# Patient Record
Sex: Male | Born: 1960 | Race: White | Hispanic: No | Marital: Single | Smoking: Never smoker
Health system: Southern US, Community
[De-identification: ages and names within clinical notes are randomized; demographics above are authoritative.]

## PROBLEM LIST (undated history)

## (undated) DIAGNOSIS — T7840XA Allergy, unspecified, initial encounter: Secondary | ICD-10-CM

## (undated) DIAGNOSIS — E119 Type 2 diabetes mellitus without complications: Secondary | ICD-10-CM

## (undated) DIAGNOSIS — I10 Essential (primary) hypertension: Secondary | ICD-10-CM

## (undated) DIAGNOSIS — K219 Gastro-esophageal reflux disease without esophagitis: Secondary | ICD-10-CM

## (undated) DIAGNOSIS — J9383 Other pneumothorax: Secondary | ICD-10-CM

## (undated) DIAGNOSIS — I2510 Atherosclerotic heart disease of native coronary artery without angina pectoris: Secondary | ICD-10-CM

## (undated) DIAGNOSIS — I7300 Raynaud's syndrome without gangrene: Secondary | ICD-10-CM

## (undated) HISTORY — PX: PLEURAL SCARIFICATION: SHX748

## (undated) HISTORY — DX: Allergy, unspecified, initial encounter: T78.40XA

## (undated) HISTORY — PX: BACK SURGERY: SHX140

---

## 2001-12-18 ENCOUNTER — Emergency Department (HOSPITAL_COMMUNITY): Admission: EM | Admit: 2001-12-18 | Discharge: 2001-12-19 | Payer: Self-pay | Admitting: Emergency Medicine

## 2001-12-19 ENCOUNTER — Encounter: Payer: Self-pay | Admitting: Emergency Medicine

## 2002-01-05 ENCOUNTER — Emergency Department (HOSPITAL_COMMUNITY): Admission: EM | Admit: 2002-01-05 | Discharge: 2002-01-05 | Payer: Self-pay | Admitting: Emergency Medicine

## 2002-01-05 ENCOUNTER — Encounter: Payer: Self-pay | Admitting: Emergency Medicine

## 2008-01-18 ENCOUNTER — Encounter: Admission: RE | Admit: 2008-01-18 | Discharge: 2008-01-18 | Payer: Self-pay | Admitting: Internal Medicine

## 2009-01-13 ENCOUNTER — Encounter: Admission: RE | Admit: 2009-01-13 | Discharge: 2009-01-13 | Payer: Self-pay | Admitting: Internal Medicine

## 2010-11-08 ENCOUNTER — Observation Stay (HOSPITAL_COMMUNITY): Admission: EM | Admit: 2010-11-08 | Discharge: 2010-04-23 | Payer: Self-pay | Admitting: Emergency Medicine

## 2011-02-18 LAB — POCT I-STAT, CHEM 8
BUN: 12 mg/dL (ref 6–23)
Calcium, Ion: 0.99 mmol/L — ABNORMAL LOW (ref 1.12–1.32)
Hemoglobin: 15.6 g/dL (ref 13.0–17.0)
Sodium: 138 mEq/L (ref 135–145)
TCO2: 25 mmol/L (ref 0–100)

## 2011-02-18 LAB — BASIC METABOLIC PANEL
BUN: 10 mg/dL (ref 6–23)
CO2: 25 mEq/L (ref 19–32)
CO2: 26 mEq/L (ref 19–32)
Chloride: 106 mEq/L (ref 96–112)
Creatinine, Ser: 0.91 mg/dL (ref 0.4–1.5)
GFR calc Af Amer: >60 mL/min (ref >60)
GFR calc non Af Amer: >60 mL/min (ref >60)
Glucose, Bld: 130 mg/dL — ABNORMAL HIGH (ref 70–99)
Glucose, Bld: 137 mg/dL — ABNORMAL HIGH (ref 70–99)
Potassium: 3.5 mEq/L (ref 3.5–5.1)
Sodium: 137 mEq/L (ref 135–145)
Sodium: 140 mEq/L (ref 135–145)

## 2011-02-18 LAB — CBC
HCT: 46.1 % (ref 39.0–52.0)
Hemoglobin: 14.7 g/dL (ref 13.0–17.0)
Hemoglobin: 16.3 g/dL (ref 13.0–17.0)
MCHC: 35.3 g/dL (ref 30.0–36.0)
MCV: 93.9 fL (ref 78.0–100.0)
Platelets: 225 10*3/uL (ref 150–400)
RBC: 4.37 MIL/uL (ref 4.22–5.81)
RDW: 12.5 % (ref 11.5–15.5)
WBC: 7.1 10*3/uL (ref 4.0–10.5)

## 2011-02-18 LAB — CARDIAC PANEL(CRET KIN+CKTOT+MB+TROPI)
CK, MB: 0.7 ng/mL (ref 0.3–4.0)
Relative Index: INVALID (ref 0.0–2.5)
Relative Index: INVALID (ref 0.0–2.5)
Total CK: 42 U/L (ref 7–232)
Troponin I: 0.01 ng/mL (ref 0.00–0.06)
Troponin I: 0.01 ng/mL (ref 0.00–0.06)

## 2011-02-18 LAB — LIPID PANEL
Cholesterol: 129 mg/dL (ref 0–200)
LDL Cholesterol: 52 mg/dL (ref 0–99)
Total CHOL/HDL Ratio: 5 RATIO
Triglycerides: 254 mg/dL — ABNORMAL HIGH (ref <150)
VLDL: 51 mg/dL — ABNORMAL HIGH (ref 0–40)

## 2011-02-18 LAB — POCT CARDIAC MARKERS: Myoglobin, poc: 38.9 ng/mL (ref 12–200)

## 2011-02-18 LAB — DIFFERENTIAL
Basophils Absolute: 0 10*3/uL (ref 0.0–0.1)
Basophils Relative: 0 % (ref 0–1)
Eosinophils Absolute: 0.4 10*3/uL (ref 0.0–0.7)
Eosinophils Relative: 5 % (ref 0–5)
Monocytes Absolute: 0.9 10*3/uL (ref 0.1–1.0)

## 2011-02-18 LAB — D-DIMER, QUANTITATIVE: D-Dimer, Quant: 0.25 ug/mL-FEU (ref 0.00–0.48)

## 2011-02-18 LAB — CK TOTAL AND CKMB (NOT AT ARMC): Relative Index: INVALID (ref 0.0–2.5)

## 2012-07-07 ENCOUNTER — Ambulatory Visit (INDEPENDENT_AMBULATORY_CARE_PROVIDER_SITE_OTHER): Payer: BC Managed Care – PPO | Admitting: Family Medicine

## 2012-07-07 VITALS — BP 142/82 | HR 75 | Temp 98.9°F | Resp 17 | Ht 68.5 in | Wt 183.0 lb

## 2012-07-07 DIAGNOSIS — K9433 Esophagostomy malfunction: Secondary | ICD-10-CM

## 2012-07-07 DIAGNOSIS — K219 Gastro-esophageal reflux disease without esophagitis: Secondary | ICD-10-CM

## 2012-07-07 DIAGNOSIS — R0682 Tachypnea, not elsewhere classified: Secondary | ICD-10-CM

## 2012-07-07 DIAGNOSIS — K921 Melena: Secondary | ICD-10-CM

## 2012-07-07 DIAGNOSIS — K279 Peptic ulcer, site unspecified, unspecified as acute or chronic, without hemorrhage or perforation: Secondary | ICD-10-CM

## 2012-07-07 DIAGNOSIS — R1013 Epigastric pain: Secondary | ICD-10-CM

## 2012-07-07 LAB — POCT CBC
Lymph, poc: 2.6 (ref 0.6–3.4)
MCHC: 33 g/dL (ref 31.8–35.4)
MID (cbc): 0.8 (ref 0–0.9)
MPV: 8.5 fL (ref 0–99.8)
POC Granulocyte: 7.6 — AB (ref 2–6.9)
POC LYMPH PERCENT: 23.6 %L (ref 10–50)
POC MID %: 7.5 %M (ref 0–12)
Platelet Count, POC: 351 10*3/uL (ref 142–424)
RDW, POC: 13 %

## 2012-07-07 MED ORDER — PANTOPRAZOLE SODIUM 40 MG PO TBEC: DELAYED_RELEASE_TABLET | ORAL | Status: DC

## 2012-07-07 NOTE — Patient Instructions (Signed)
Stop the aspirin and etodolac Take your stomach medicine, pantoprazole, twice daily Don't take any Pepto-Bismol. If you see further black stools but me know.  We will make a referral for you to the gastroenterologist and call you when that appointment is for.

## 2012-07-07 NOTE — Progress Notes (Signed)
Subjective: Patient was in [place] a few days ago and had severe epigastric pain. He's had GERD problems for a long time. It causes him to fill up the other night. He was sleeping in his truck and throughout out of the door the truck so he doesn't know whether he vomited blood or not. It is in the dark. He continued to hurt. Today he had a tarry stool when he is on his way up to  [place]. 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 He has had a colonoscopy but has never had an EGD.  Objective: Chest clear. Heart regular. Abdomen soft, very tender midepigastrium, normal bowel sounds. Digital work exam has black stool. He does have little internal hemorrhoid. Extremities normal his color is good.  Results for orders placed in visit on 07/07/12  IFOBT (OCCULT BLOOD)      Component Value Range   IFOBT Positive    POCT CBC      Component Value Range   WBC 11.0 (*) 4.6 - 10.2 K/uL   Lymph, poc 2.6  0.6 - 3.4   POC LYMPH PERCENT 23.6  10 - 50 %L   MID (cbc) 0.8  0 - 0.9   POC MID % 7.5  0 - 12 %M   POC Granulocyte 7.6 (*) 2 - 6.9   Granulocyte percent 68.9  37 - 80 %G   RBC 5.36  4.69 - 6.13 M/uL   Hemoglobin 17.0  14.1 - 18.1 g/dL   HCT, POC 16.1  09.6 - 53.7 %   MCV 96.0  80 - 97 fL   MCH, POC 31.7 (*) 27 - 31.2 pg   MCHC 33.0  31.8 - 35.4 g/dL   RDW, POC 04.5     Platelet Count, POC 351  142 - 424 K/uL   MPV 8.5  0 - 99.8 fL   Assessment: Tarry stools. He has a weakly positive, in retrospect this may be from the Pepto-Bismol he took a few days ago.  Epigastric pain Internal hemorrhoid GERD Suspicion for peptic ulcer disease  Plan: Refer to gastroenterology for an EGD Increase the PPI to twice a day Stop the aspirin Absolutely no NSAID

## 2012-07-20 ENCOUNTER — Emergency Department (HOSPITAL_BASED_OUTPATIENT_CLINIC_OR_DEPARTMENT_OTHER): Payer: BC Managed Care – PPO

## 2012-07-20 ENCOUNTER — Encounter (HOSPITAL_BASED_OUTPATIENT_CLINIC_OR_DEPARTMENT_OTHER): Payer: Self-pay | Admitting: Emergency Medicine

## 2012-07-20 ENCOUNTER — Emergency Department (HOSPITAL_BASED_OUTPATIENT_CLINIC_OR_DEPARTMENT_OTHER)
Admission: EM | Admit: 2012-07-20 | Discharge: 2012-07-20 | Disposition: A | Discharge status: Home / Self Care | Payer: BC Managed Care – PPO | Attending: Emergency Medicine | Admitting: Emergency Medicine

## 2012-07-20 DIAGNOSIS — Z7982 Long term (current) use of aspirin: Secondary | ICD-10-CM | POA: Insufficient documentation

## 2012-07-20 DIAGNOSIS — I2510 Atherosclerotic heart disease of native coronary artery without angina pectoris: Secondary | ICD-10-CM | POA: Insufficient documentation

## 2012-07-20 DIAGNOSIS — M545 Low back pain, unspecified: Secondary | ICD-10-CM | POA: Insufficient documentation

## 2012-07-20 DIAGNOSIS — K219 Gastro-esophageal reflux disease without esophagitis: Secondary | ICD-10-CM | POA: Insufficient documentation

## 2012-07-20 DIAGNOSIS — I7300 Raynaud's syndrome without gangrene: Secondary | ICD-10-CM | POA: Insufficient documentation

## 2012-07-20 DIAGNOSIS — I10 Essential (primary) hypertension: Secondary | ICD-10-CM | POA: Insufficient documentation

## 2012-07-20 HISTORY — DX: Atherosclerotic heart disease of native coronary artery without angina pectoris: I25.10

## 2012-07-20 HISTORY — DX: Raynaud's syndrome without gangrene: I73.00

## 2012-07-20 HISTORY — DX: Gastro-esophageal reflux disease without esophagitis: K21.9

## 2012-07-20 HISTORY — DX: Essential (primary) hypertension: I10

## 2012-07-20 MED ORDER — ONDANSETRON HCL 4 MG/2ML IJ SOLN
4.0000 mg | Freq: Once | INTRAMUSCULAR | Status: AC
  Administered 2012-07-20: 4 mg via INTRAVENOUS
  Filled 2012-07-20: qty 2

## 2012-07-20 MED ORDER — DIAZEPAM 10 MG PO TABS: 10.0000 mg | ORAL_TABLET | Freq: Three times a day (TID) | ORAL | Status: DC | PRN

## 2012-07-20 MED ORDER — HYDROMORPHONE HCL PF 1 MG/ML IJ SOLN
1.0000 mg | Freq: Once | INTRAMUSCULAR | Status: AC
  Administered 2012-07-20: 1 mg via INTRAVENOUS
  Filled 2012-07-20: qty 1

## 2012-07-20 MED ORDER — DIAZEPAM 5 MG PO TABS
10.0000 mg | ORAL_TABLET | Freq: Once | ORAL | Status: AC
  Administered 2012-07-20: 10 mg via ORAL
  Filled 2012-07-20: qty 2

## 2012-07-20 MED ORDER — OXYCODONE-ACETAMINOPHEN 5-325 MG PO TABS
1.0000 | ORAL_TABLET | Freq: Once | ORAL | Status: AC
  Administered 2012-07-20: 1 via ORAL
  Filled 2012-07-20 (×2): qty 1

## 2012-07-20 NOTE — ED Notes (Addendum)
Walked pt in the dept.  Pt had steady gate and tolerated well.  Notified MD.

## 2012-07-20 NOTE — ED Provider Notes (Addendum)
History  This chart was scribed for Gwyneth Sprout, MD by Bennett Scrape. This patient was seen in room MH08/MH08 and the patient's care was started at 8:32PM.  CSN: [id number]  Arrival date & time 07/20/12  1954   First MD Initiated Contact with Patient 07/20/12 2032      Chief Complaint  Patient presents with  . Back Pain    The history is provided by the patient. No language interpreter was used.    Zachary Estrada is a 51 y.o. 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 He reports that the pain became unbearable after he sneezed and felt a "pop". The pain is improved with rest and aggravated with all movements. 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 Lovell Sheehan and takes Lodine 500 mg twice daily. He states that he was recently diagnosed with 3 stomach ulcers and was told to stop the Lodine. He reports that he tried to get into see Dr. Lovell Sheehan today but since his last appointment was in 2009, they stated that they needed 2 or 3 days to pull his paperwork from their files. He states that he then got into see his PCP's PA and received a shot of Toradol and Dilaudid in both hips. He states that the pain has become worse after the shots. He was also given Vicodin and prednisone but has not taken either because he hasn't eaten anything today. He denies prior episodes of similar symptoms. He denies fever, neck pain, sore throat, visual disturbance, CP, cough, SOB, abdominal pain, emesis, diarrhea, urinary symptoms, HA, weakness, and rash as associated symptoms. He has a h/o CAD, HTN and GERD. He denies smoking and alcohol use.  Dr. Peggye Ley is spine specialist.  Dr. Ronnie Derby is PCP.  Past Medical History  Diagnosis Date    . Coronary artery disease   . Hypertension   . GERD (gastroesophageal reflux disease)   . Raynaud disease     Past Surgical History  Procedure Date  . Back surgery     History reviewed. No pertinent family history.  History  Substance Use Topics  . Smoking status: Never Smoker   . Smokeless tobacco: Not on file  . Alcohol Use: No      Review of Systems  A complete 10 system review of systems was obtained and all systems are negative except as noted in the HPI and PMH.   Allergies  Review of patient's allergies indicates no known allergies.  Home Medications   Current Outpatient Rx  Name Route Sig Dispense Refill  . ASPIRIN 81 MG PO CHEW Oral Chew 81 mg by mouth daily.    . ATORVASTATIN CALCIUM 40 MG PO TABS Oral Take 40 mg by mouth daily.    . ETODOLAC 500 MG PO TABS Oral Take 500 mg by mouth 2 (two) times daily.    Marland Kitchen PANTOPRAZOLE SODIUM 40 MG PO TBEC  Take one twice daily for stomach 180 tablet 1  . TRIAMTERENE-HCTZ 37.5-25 MG PO CAPS Oral Take 1 capsule by mouth every morning.      Triage Vitals: BP 130/90  Pulse 62  Temp 98.3 F (36.8 C) (Oral)  Resp 18  Ht 5' 8.5" (1.74 m)  Wt 183  lb (83.008 kg)  BMI 27.42 kg/m2  SpO2 100%  Physical Exam  Nursing note and vitals reviewed. Constitutional: He is oriented to person, place, and time. He appears well-developed and well-nourished. No distress.  HENT:  Head: Normocephalic and atraumatic.  Eyes: EOM are normal.  Neck: Neck supple. No tracheal deviation present.  Cardiovascular: Normal rate and regular rhythm.   No murmur heard. Pulmonary/Chest: Effort normal and breath sounds normal. No respiratory distress.  Abdominal: Soft. Bowel sounds are normal. There is no tenderness. There is no rebound and no guarding.  Musculoskeletal: Normal range of motion. He exhibits tenderness. He exhibits no edema.       Lumbar Tenderness over the l spine and paralumbar muscles, normal pulses  Neurological: He is alert and  oriented to person, place, and time.       Normal motor and sensation  Skin: Skin is warm and dry.  Psychiatric: He has a normal mood and affect. His behavior is normal.    ED Course  Procedures (including critical care time)  DIAGNOSTIC STUDIES: Oxygen Saturation is 100% on room air, normal by my interpretation.    COORDINATION OF CARE: 8:48PM-Discussed treatment plan which includes an x-ray with pt at bedside and pt agreed to plan.  10:23PM-Pt rechecked and feels better. Informed pt of negative radiology reports. Discussed discharge plan and pt agreed.   Labs Reviewed - No data to display Dg Lumbar Spine Complete  07/20/2012  *RADIOLOGY REPORT*  Clinical Data: Pain post lifting.  LUMBAR SPINE - COMPLETE 4+ VIEW  Comparison:  01/13/2009  Findings: Mild narrowing L4-5 as before. There is no evidence of lumbar spine fracture.  Alignment is normal.  Intervertebral disc spaces are maintained.  IMPRESSION: Negative.   Original Report Authenticated By: Thora Lance III, M.D.      1. Lumbar pain       MDM   Pt with acute onset of back pain suggestive of lumbar disc disease.  No neurovascular compromise and no incontinence.  Pt has no infectious sx, hx of CA  or other red flags concerning for pathologic back pain.  Pt is able to ambulate but is painful.  Normal strength and reflexes on exam.  Denies trauma.  He saw his PCP today and was given IM Dilaudid and Toradol which did not improve his pain. Patient has never had back pain like this before. States that he did feel a pop when the pain started. Lumbar films are within normal limits. After 10 mg of Valium and 1 mg of Dilaudid patient is feeling much better with 2/10 pain. Patient has pain medication at home but does not have muscle relaxer. He was given Valium and will followup with Dr. Lovell Sheehan with neurosurgery should be calling him back to  I personally performed the services described in this documentation, which was scribed in  my presence.  The recorded information has been reviewed and considered.       Gwyneth Sprout, MD 07/20/12 4540  Gwyneth Sprout, MD 07/20/12 2245

## 2012-07-20 NOTE — ED Notes (Signed)
Pt had work today, while at worked heard something pop, went to see pcp, was given two injections toradol and demerol...the patient received no relief, went home and was unable to ambulate due to pain

## 2012-07-23 ENCOUNTER — Observation Stay (HOSPITAL_COMMUNITY)
Admission: EM | Admit: 2012-07-23 | Discharge: 2012-07-24 | Disposition: A | Discharge status: Home / Self Care | Payer: BC Managed Care – PPO | Attending: Emergency Medicine | Admitting: Emergency Medicine

## 2012-07-23 ENCOUNTER — Encounter (HOSPITAL_COMMUNITY): Payer: Self-pay | Admitting: *Deleted

## 2012-07-23 DIAGNOSIS — M549 Dorsalgia, unspecified: Secondary | ICD-10-CM

## 2012-07-23 DIAGNOSIS — K219 Gastro-esophageal reflux disease without esophagitis: Secondary | ICD-10-CM | POA: Insufficient documentation

## 2012-07-23 DIAGNOSIS — G8929 Other chronic pain: Principal | ICD-10-CM | POA: Insufficient documentation

## 2012-07-23 DIAGNOSIS — M545 Low back pain, unspecified: Secondary | ICD-10-CM | POA: Insufficient documentation

## 2012-07-23 DIAGNOSIS — I2510 Atherosclerotic heart disease of native coronary artery without angina pectoris: Secondary | ICD-10-CM | POA: Insufficient documentation

## 2012-07-23 DIAGNOSIS — I10 Essential (primary) hypertension: Secondary | ICD-10-CM | POA: Insufficient documentation

## 2012-07-23 MED ORDER — DIAZEPAM 5 MG PO TABS: 5.0000 mg | ORAL_TABLET | Freq: Four times a day (QID) | ORAL | Status: DC | PRN

## 2012-07-23 MED ORDER — ONDANSETRON HCL 4 MG/2ML IJ SOLN: 4.0000 mg | Freq: Four times a day (QID) | INTRAMUSCULAR | Status: DC | PRN

## 2012-07-23 MED ORDER — OXYCODONE-ACETAMINOPHEN 5-325 MG PO TABS: 1.0000 | ORAL_TABLET | ORAL | Status: DC | PRN

## 2012-07-23 MED ORDER — HYDROMORPHONE HCL PF 1 MG/ML IJ SOLN
1.0000 mg | INTRAMUSCULAR | Status: AC
  Administered 2012-07-23: 1 mg via INTRAVENOUS
  Filled 2012-07-23: qty 1

## 2012-07-23 MED ORDER — DIAZEPAM 5 MG/ML IJ SOLN
5.0000 mg | Freq: Once | INTRAMUSCULAR | Status: AC
  Administered 2012-07-23: 5 mg via INTRAVENOUS
  Filled 2012-07-23: qty 2

## 2012-07-23 MED ORDER — KETOROLAC TROMETHAMINE 30 MG/ML IJ SOLN: 30.0000 mg | Freq: Four times a day (QID) | INTRAMUSCULAR | Status: DC | PRN

## 2012-07-23 MED ORDER — HYDROMORPHONE HCL PF 1 MG/ML IJ SOLN: 1.0000 mg | INTRAMUSCULAR | Status: DC | PRN

## 2012-07-23 NOTE — ED Provider Notes (Signed)
History     CSN: [id number]  Arrival date & time 07/23/12  [place]   First MD Initiated Contact with Patient 07/23/12 2031      Chief Complaint  Patient presents with  . 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  Reports severe, constant pain in his lower back.  States yesterday it radiated down his right leg but today there is no radiation.  Denies weakness or numbness of the leg.  Denies fevers, abdominal pain, bowel or bladder changes, retention, or incontinence.  Pt was seen yesterday at [facility] ED with xrays and pain control, d/c home with vicodin, prednisone, and valium, which he states are not helping at all.  Pt has seen Dr Lovell Sheehan for his back in the past but cannot get in to see him for 5 more days.    Patient is a 51 y.o. male presenting with back pain. The history is provided by the patient.  Back Pain  Pertinent negatives include no fever, no numbness, no abdominal pain, no dysuria and no weakness.    Past Medical History  Diagnosis Date  . Coronary artery disease   . Hypertension   . GERD (gastroesophageal reflux disease)   . Raynaud disease     Past Surgical History  Procedure Date  . Back surgery     No family history on file.  History  Substance Use Topics  . Smoking status: Never Smoker   . Smokeless tobacco: Not on file  . Alcohol Use: No      Review of Systems  Constitutional: Negative for fever and chills.  Gastrointestinal: Negative for nausea, vomiting, abdominal pain, diarrhea, constipation and blood in stool.  Genitourinary: Negative for dysuria, urgency and frequency.  Musculoskeletal: Positive for back pain.  Neurological: Negative for weakness and  numbness.    Allergies  Review of patient's allergies indicates no known allergies.  Home Medications   Current Outpatient Rx  Name Route Sig Dispense Refill  . ASPIRIN 81 MG PO CHEW Oral Chew 81 mg by mouth daily.    . ATORVASTATIN CALCIUM 40 MG PO TABS Oral Take 40 mg by mouth daily.    Marland Kitchen DIAZEPAM 10 MG PO TABS Oral Take 10 mg by mouth every 8 (eight) hours as needed. For muscle spasms    . ETODOLAC 500 MG PO TABS Oral Take 500 mg by mouth daily as needed. For pain    . HYDROCODONE-ACETAMINOPHEN 5-500 MG PO TABS Oral Take 1 tablet by mouth every 6 (six) hours as needed. For pain    . PANTOPRAZOLE SODIUM 40 MG PO TBEC Oral Take 40 mg by mouth daily.    . TRIAMTERENE-HCTZ 37.5-25 MG PO TABS Oral Take 0.5 tablets by mouth daily.      BP 120/85  Pulse 74  Temp 98.4 F (36.9 C) (Oral)  Resp 18  SpO2 98%  Physical Exam  Nursing note and vitals reviewed. Constitutional: He appears well-developed and well-nourished. No distress.  HENT:  Head: Normocephalic and atraumatic.  Neck: Neck supple.  Cardiovascular: Normal rate and regular rhythm.   Pulmonary/Chest: Effort normal and breath sounds normal.  Abdominal: Soft. He exhibits no distension and no mass. There is no tenderness. There is no rebound and no guarding.  Musculoskeletal:  Cervical back: He exhibits no bony tenderness.       Thoracic back: He exhibits no bony tenderness.       Lumbar back: He exhibits bony tenderness. He exhibits no deformity.       Diffuse tenderness to L-spine without crepitus or step offs.  Lower extremities:  Strength 5/5, sensation intact, distal pulses intact.    Neurological: He is alert.  Skin: He is not diaphoretic.    ED Course  Procedures (including critical care time)  Labs Reviewed - No data to display No results found.  8:48 PM Patient seen and examined.  Will attempt pain control.  Pt has no neurological deficits, no need for emergent MRI at this time.  Will consider back pain  protocol and MRI if no improvement with medications.    10:01 PM Discussed patient with Dr Lorenso Courier who agrees patient is appropriate for back pain protocol.  Will also see patient.  Discussed patient with Johnnette Gourd, PA-C, current CDU PA who will assume care of patient upon arrival to CDU.    1. Low back pain       MDM  Pt with known L4-5 pathology with injury 4 days ago presents with uncontrolled back pain.  Seen in ED yesterday for same with xrays showing narrowing of the L4-5 space, no fractures.  Pt does not have radicular symptoms presently and no weakness or numbness of his legs.  His pain, however, is uncontrolled.  Pt discussed with Dr Lorenso Courier and pt placed on back pain protocol in CDU.  Discussed MRI with Dr Lorenso Courier and he agrees that it is not medically necessary at this time.  Pt signed out to Johnnette Gourd, PA-C, who assumes care of patient in CDU.          [place], [place] 07/23/12 2356

## 2012-07-23 NOTE — ED Notes (Signed)
The pt has chronic back pain  And since yesterday his pain has been worse.  [facility] and is not satisfied with the treatemnt he received there.  Medication not helping his pain

## 2012-07-24 MED ORDER — OXYCODONE-ACETAMINOPHEN 5-325 MG PO TABS: 2.0000 | ORAL_TABLET | ORAL | Status: DC | PRN

## 2012-07-24 MED ORDER — DEXAMETHASONE SODIUM PHOSPHATE 10 MG/ML IJ SOLN
10.0000 mg | Freq: Once | INTRAMUSCULAR | Status: AC
  Administered 2012-07-24: 10 mg via INTRAVENOUS
  Filled 2012-07-24: qty 1

## 2012-07-24 MED ORDER — DIAZEPAM 5 MG PO TABS: 5.0000 mg | ORAL_TABLET | Freq: Two times a day (BID) | ORAL | Status: DC

## 2012-07-24 MED ORDER — OXYCODONE-ACETAMINOPHEN 5-325 MG PO TABS
2.0000 | ORAL_TABLET | Freq: Once | ORAL | Status: AC
  Administered 2012-07-24: 2 via ORAL
  Filled 2012-07-24: qty 2

## 2012-07-24 NOTE — ED Provider Notes (Signed)
Agree with plan of care  Tobin Chad, MD 07/24/12 [phone]

## 2012-07-24 NOTE — ED Provider Notes (Signed)
Pt presents for evaluation of back pain. 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  He has a known hx of degen disc dx but has not had very many issues with related pain.  He was seen at [facility] yesterday and prescribed prednisone, valium, and percocet.  He is here secondary to persistent pain.  He denies any neurologic s/s other than the pain radiated down his right leg initially.  This has since resolved.  PE.  Diffuse lower back tenderness to palpation + musc spasm.  No step-offs or midline point tenderness.  No saddle anesthesia.  Intact and symmetric DTRs.  Neg Left straight leg raise.  Pain exacerbated at ~45 degrees on right.  A/P.  Pt presents for evaluation of back pain.  No focal deficit or clinical evidence of cauda equina syndrome.  At this time plan symptomatic care via IV narcotics, toradol, and valium.  Will also administer IV decadron.  Plan serial exams and transition to PO meds.  Pt already has f/u with a spine specialist next week.  Tobin Chad, MD 07/24/12 [phone]

## 2012-07-24 NOTE — ED Notes (Signed)
Pt resting quietly at this time with friend at bedside.  Saline lock in right hand, flushed with saline without any problems.

## 2012-07-24 NOTE — ED Notes (Signed)
Patient able to ambulate approx 30 feet in unit unassisted. Gait steady.

## 2012-07-24 NOTE — ED Provider Notes (Signed)
51 y/o male moved to CDU on back pain protocol. Plan to manage pain. No focal neuro deficits concerning need for MRI. Case discussed with Dr. Lorenso Courier who also evaluated patient. Will give dilaudid, decadron, valium. Case also discussed with Dr. Lynelle Doctor who will take over care of patient at this time. MRI from 2009: Clinical Data: 51 year old with low back and buttock pain occasionally down right leg.  MRI LUMBAR SPINE WITHOUT CONTRAST:  Technique: Multiplanar and multiecho pulse sequences of the lumbar spine, to include the lower thoracic and upper sacral regions, were obtained according to standard protocol without IV contrast.  Comparison: Outside plain films, 01/11/08.  Findings: The sagittal MR images demonstrate normal overall alignment of the lumbar vertebral bodies. Very slight retrolisthesis of L4 compared to L5 likely due to facet disease. No definite pars defects are seen. The vertebral bodies demonstrate normal marrow signal except for some endplate reactive changes at L4 and Schmorl's node. There are five non-rib bearing lumbar type vertebral bodies based on the plain films and the last full intervertebral disk space is labeled L5-S1. The conus medullaris terminates at T12.  No significant findings at L1-2, L2-3, or L3-4.  L4-5: There is an annular rent and a central disk protrusion which does have slight mass effect on the anterior thecal sac. No foraminal stenosis. Mild facet disease.  L5-S1: Mild disk desiccation and degeneration. There is a shallow central disk protrusion which barely contacts the anterior thecal sac. It also comes close to the right S1 nerve root.  IMPRESSION:  Small disk protrusions at L4-5 and L5-S1. No foraminal stenosis.  Provider: Argie Ramming        Trevor Mace, PA-C 07/24/12 [phone]

## 2012-07-30 ENCOUNTER — Emergency Department (HOSPITAL_COMMUNITY)
Admission: EM | Admit: 2012-07-30 | Discharge: 2012-07-31 | Disposition: A | Discharge status: Home / Self Care | Payer: BC Managed Care – PPO | Attending: Emergency Medicine | Admitting: Emergency Medicine

## 2012-07-30 ENCOUNTER — Encounter (HOSPITAL_COMMUNITY): Payer: Self-pay | Admitting: *Deleted

## 2012-07-30 DIAGNOSIS — F1010 Alcohol abuse, uncomplicated: Secondary | ICD-10-CM | POA: Insufficient documentation

## 2012-07-30 DIAGNOSIS — I10 Essential (primary) hypertension: Secondary | ICD-10-CM | POA: Insufficient documentation

## 2012-07-30 DIAGNOSIS — F3289 Other specified depressive episodes: Secondary | ICD-10-CM | POA: Insufficient documentation

## 2012-07-30 DIAGNOSIS — R45851 Suicidal ideations: Secondary | ICD-10-CM | POA: Insufficient documentation

## 2012-07-30 DIAGNOSIS — Z79899 Other long term (current) drug therapy: Secondary | ICD-10-CM | POA: Insufficient documentation

## 2012-07-30 DIAGNOSIS — K219 Gastro-esophageal reflux disease without esophagitis: Secondary | ICD-10-CM | POA: Insufficient documentation

## 2012-07-30 DIAGNOSIS — F329 Major depressive disorder, single episode, unspecified: Secondary | ICD-10-CM | POA: Insufficient documentation

## 2012-07-30 DIAGNOSIS — I2510 Atherosclerotic heart disease of native coronary artery without angina pectoris: Secondary | ICD-10-CM | POA: Insufficient documentation

## 2012-07-30 LAB — RAPID URINE DRUG SCREEN, HOSP PERFORMED
Benzodiazepines: POSITIVE — AB
Cocaine: NOT DETECTED
Opiates: NOT DETECTED

## 2012-07-30 NOTE — ED Notes (Signed)
Pts Father taking belongings and meds to fiance

## 2012-07-30 NOTE — ED Notes (Signed)
[id number]<WG> Expected date:07/30/12<BR> Expected time: 9:31 PM<BR> Means of arrival:Ambulance<BR> Comments:<BR> Hold for suicidal EMS

## 2012-07-30 NOTE — ED Notes (Signed)
Pt presents with SI. Pt has a plan to shoot himself. Pt recently lost his job and having depression. Pt admits to drinking 1/2 bottle of wine tonight. Family arriving shortly.

## 2012-07-31 LAB — ETHANOL: Alcohol, Ethyl (B): 61 mg/dL — ABNORMAL HIGH (ref 0–11)

## 2012-07-31 LAB — COMPREHENSIVE METABOLIC PANEL
Albumin: 4.3 g/dL (ref 3.5–5.2)
BUN: 13 mg/dL (ref 6–23)
Calcium: 9.5 mg/dL (ref 8.4–10.5)
Creatinine, Ser: 0.88 mg/dL (ref 0.50–1.35)
GFR calc Af Amer: >90 mL/min (ref >90)
Potassium: 3.6 mEq/L (ref 3.5–5.1)
Total Protein: 7.5 g/dL (ref 6.0–8.3)

## 2012-07-31 LAB — CBC
HCT: 49.9 % (ref 39.0–52.0)
MCH: 32.3 pg (ref 26.0–34.0)
MCHC: 37.3 g/dL — ABNORMAL HIGH (ref 30.0–36.0)
MCV: 86.8 fL (ref 78.0–100.0)
RDW: 12.2 % (ref 11.5–15.5)

## 2012-07-31 LAB — ACETAMINOPHEN LEVEL: Acetaminophen (Tylenol), Serum: <15 ug/mL (ref 10–30)

## 2012-07-31 MED ORDER — PANTOPRAZOLE SODIUM 40 MG PO TBEC
40.0000 mg | DELAYED_RELEASE_TABLET | Freq: Every day | ORAL | Status: DC
  Administered 2012-07-31: 40 mg via ORAL
  Filled 2012-07-31: qty 1

## 2012-07-31 MED ORDER — TRIAMTERENE-HCTZ 37.5-25 MG PO TABS
0.5000 | ORAL_TABLET | Freq: Every day | ORAL | Status: DC
  Administered 2012-07-31: 0.5 via ORAL
  Filled 2012-07-31: qty 0.5

## 2012-07-31 MED ORDER — ATORVASTATIN CALCIUM 40 MG PO TABS
40.0000 mg | ORAL_TABLET | Freq: Every day | ORAL | Status: DC
  Administered 2012-07-31: 40 mg via ORAL
  Filled 2012-07-31: qty 1

## 2012-07-31 MED ORDER — DIAZEPAM 5 MG PO TABS
5.0000 mg | ORAL_TABLET | Freq: Two times a day (BID) | ORAL | Status: DC
  Administered 2012-07-31 (×3): 5 mg via ORAL
  Filled 2012-07-31 (×3): qty 1

## 2012-07-31 MED ORDER — DIAZEPAM 5 MG PO TABS: 5.0000 mg | ORAL_TABLET | Freq: Two times a day (BID) | ORAL | Status: AC | PRN

## 2012-07-31 MED ORDER — OXYCODONE-ACETAMINOPHEN 5-325 MG PO TABS
1.0000 | ORAL_TABLET | ORAL | Status: DC | PRN
  Administered 2012-07-31 (×4): 2 via ORAL
  Filled 2012-07-31 (×4): qty 2

## 2012-07-31 MED ORDER — ASPIRIN 81 MG PO CHEW
81.0000 mg | CHEWABLE_TABLET | Freq: Every day | ORAL | Status: DC
  Administered 2012-07-31: 81 mg via ORAL
  Filled 2012-07-31: qty 1

## 2012-07-31 NOTE — ED Provider Notes (Signed)
Pt seen by tele=psych and cleared for discharge--I have spoke with the pt and agree and will give rx for valium  Toy Baker, MD 07/31/12 2251

## 2012-07-31 NOTE — BH Assessment (Addendum)
Assessment Note   Zachary Estrada is a 51 y.o. male who was brought in by family after reportedly endorsing SI with a plan to shoot himself with a gun after drinking 1/2 a bottle of wine. Pt denies current SI, stating he had no intention of shooting himself last night. He states he asked his 49 year old to "get my pistol so I could sit outside because it was a nice night." Pt went on the explain that his neighborhood has " a lot of hoodlums" so he felt like he needed his pistol to be safe outside. Pt states he fiance is worried about him because he has been having finical stressors and chronic pain. He denies current HI, but states he will hurt someone "if they get in my way." He denies [facility], depressive symptoms, and anxiety. Pt states that he has chronic back pain and is constantly in a lot of pain that interferes with his daily functioning. Pt states he lives with his fiance and 49 year-old son. He access to a pistol. He states he feels safe to return home at this time, stating he was never suicidal.   Telepsych has been requested to assist with disposition.       Axis I: Depressive Disorder NOS Axis II: Deferred Axis III:  Past Medical History  Diagnosis Date  . Coronary artery disease   . Hypertension   . GERD (gastroesophageal reflux disease)   . Raynaud disease    Axis IV: economic problems and other psychosocial or environmental problems Axis V: 31-40 impairment in reality testing  Past Medical History:  Past Medical History  Diagnosis Date  . Coronary artery disease   . Hypertension   . GERD (gastroesophageal reflux disease)   . Raynaud disease     Past Surgical History  Procedure Date  . Back surgery     Family History: History reviewed. No pertinent family history.  Social History:  reports that he has never smoked. He does not have any smokeless tobacco history on file. He reports that he does not drink alcohol. His drug history not on file.  Additional Social History:   Alcohol / Drug Use History of alcohol / drug use?: Yes Substance #1 Name of Substance 1: alcohol 1 - Age of First Use: 23 1 - Amount (size/oz): 2 glasses of red wine 1 - Frequency: daily 1 - Duration: for several years, states doctor told him it was healthy for his heart 1 - Last Use / Amount: 07/30/12  CIWA: CIWA-Ar BP: 127/74 mmHg Pulse Rate: 69  Nausea and Vomiting: no nausea and no vomiting Tactile Disturbances: none Tremor: no tremor Auditory Disturbances: not present Paroxysmal Sweats: no sweat visible Visual Disturbances: not present Anxiety: mildly anxious Headache, Fullness in Head: none present Agitation: normal activity Orientation and Clouding of Sensorium: oriented and can do serial additions CIWA-Ar Total: 1  COWS:    Allergies: No Known Allergies  Home Medications:  (Not in a hospital admission)  OB/GYN Status:  No LMP for male patient.  General Assessment Data Location of Assessment: [facility] Living Arrangements: Spouse/significant other;Children (35 year old) Can pt return to current living arrangement?: Yes Admission Status: Voluntary Is patient capable of signing voluntary admission?: Yes Transfer from: Acute Hospital Referral Source: MD  Education Status Is patient currently in school?: No  Risk to self Suicidal Ideation: No Suicidal Intent: No Is patient at risk for suicide?: Yes Suicidal Plan?: No Access to Means: Yes Specify Access to Suicidal Means: owns  a pistol What has been your use of drugs/alcohol within the last 12 months?: alcohol use Previous Attempts/Gestures: No How many times?: 0  Other Self Harm Risks: none Triggers for Past Attempts: None known Intentional Self Injurious Behavior: None Family Suicide History: Yes (cousin comitted suicide) Recent stressful life event(s): Job Loss Persecutory voices/beliefs?: No Depression: Yes Depression Symptoms: Despondent;Isolating Substance abuse history and/or treatment for substance  abuse?: No Suicide prevention information given to non-admitted patients: Not applicable  Risk to Others Homicidal Ideation: No-Not Currently/Within Last 6 Months Thoughts of Harm to Others: No-Not Currently Present/Within Last 6 Months Current Homicidal Intent: No-Not Currently/Within Last 6 Months Current Homicidal Plan: No Access to Homicidal Means: Yes Describe Access to Homicidal Means: has a pistol Identified Victim: none History of harm to others?: No Assessment of Violence: None Noted Violent Behavior Description: cooperative Does patient have access to weapons?: No Criminal Charges Pending?: No Does patient have a court date: No  Psychosis Hallucinations: None noted Delusions: None noted  Mental Status Report Appear/Hygiene: Other (Comment) (casual) Eye Contact: Fair Motor Activity: Unremarkable Speech: Logical/coherent Level of Consciousness: Quiet/awake Mood: Depressed Affect: Blunted Anxiety Level: None Thought Processes: Coherent;Relevant Judgement: Unimpaired Orientation: Place;Time;Situation;Person Obsessive Compulsive Thoughts/Behaviors: None  Cognitive Functioning Concentration: Normal Memory: Recent Intact;Remote Intact IQ: Average Insight: Fair Impulse Control: Fair Appetite: Good Weight Loss: 0  Weight Gain: 0  Sleep: No Change Vegetative Symptoms: None  ADLScreening [facility] Assessment Services) Patient's cognitive ability adequate to safely complete daily activities?: Yes Patient able to express need for assistance with ADLs?: Yes Independently performs ADLs?: Yes (appropriate for developmental age)  Abuse/Neglect [facility]) Physical Abuse: Denies Verbal Abuse: Denies Sexual Abuse: Denies  Prior Inpatient Therapy Prior Inpatient Therapy: No Prior Therapy Dates: na Prior Therapy Facilty/Provider(s): na Reason for Treatment: na  Prior Outpatient Therapy Prior Outpatient Therapy: No Prior Therapy Dates: na Prior Therapy Facilty/Provider(s):  na Reason for Treatment: na  ADL Screening (condition at time of admission) Patient's cognitive ability adequate to safely complete daily activities?: Yes Patient able to express need for assistance with ADLs?: Yes Independently performs ADLs?: Yes (appropriate for developmental age) Weakness of Legs: None Weakness of Arms/Hands: None  Home Assistive Devices/Equipment Home Assistive Devices/Equipment: None    Abuse/Neglect Assessment (Assessment to be complete while patient is alone) Physical Abuse: Denies Verbal Abuse: Denies Sexual Abuse: Denies Exploitation of patient/patient's resources: Denies Self-Neglect: Denies Values / Beliefs Cultural Requests During Hospitalization: None Spiritual Requests During Hospitalization: None   Advance Directives (For Healthcare) Advance Directive: Patient does not have advance directive;Patient would not like information Pre-existing out of facility DNR order (yellow form or pink MOST form): No Nutrition Screen- [facility] Patient's home diet: Regular Have you recently lost weight without trying?: No Have you been eating poorly because of a decreased appetite?: Yes Malnutrition Screening Tool Score: 1   Additional Information 1:1 In Past 12 Months?: No CIRT Risk: No Elopement Risk: No Does patient have medical clearance?: Yes     Disposition:  Disposition Disposition of Patient: Other dispositions (pending telepsych)  Telepsych recommends pt be discharged home with outpatient referrals. EDP notified and agrees with plan. Pt signed no harm contract and accepted outpatient referrals.   On Site Evaluation by:   Reviewed with Physician:     Marjean Donna 07/31/2012 8:33 PM

## 2012-07-31 NOTE — ED Provider Notes (Signed)
History     CSN: [id number] Arrival date & time 07/30/12  2140 First MD Initiated Contact with Patient 07/31/12 0001      Chief Complaint  Patient presents with  . Medical Clearance   HPI Patient presents to the emergency room with complaints of depression and suicidal ideation. Patient has been having a lot of issues including a recent job loss and trouble with chronic back pain. He has history of prior back surgery and has had a recent exacerbation of his pain. In fact, patient had an outpatient MRI from which he is awaiting the results.  Patient states he was taking his pain medications and then started drinking some alcohol this evening. He became very depressed. He asked his son to get his gun. Patient was brought in by friend for treatment and evaluation Past Medical History  Diagnosis Date  . Coronary artery disease   . Hypertension   . GERD (gastroesophageal reflux disease)   . Raynaud disease     Past Surgical History  Procedure Date  . Back surgery     History reviewed. No pertinent family history.  History  Substance Use Topics  . Smoking status: Never Smoker   . Smokeless tobacco: Not on file  . Alcohol Use: No      Review of Systems  All other systems reviewed and are negative.    Allergies  Review of patient's allergies indicates no known allergies.  Home Medications   Current Outpatient Rx  Name Route Sig Dispense Refill  . ASPIRIN 81 MG PO CHEW Oral Chew 81 mg by mouth daily.    . ATORVASTATIN CALCIUM 40 MG PO TABS Oral Take 40 mg by mouth daily.    Marland Kitchen DIAZEPAM 5 MG PO TABS Oral Take 5 mg by mouth 2 (two) times daily.    . OMEGA-3 FATTY ACIDS 1000 MG PO CAPS Oral Take 1 g by mouth daily.    . ADULT MULTIVITAMIN W/MINERALS CH Oral Take 1 tablet by mouth daily.    . OXYCODONE-ACETAMINOPHEN 5-325 MG PO TABS Oral Take 2 tablets by mouth every 4 (four) hours as needed.    Marland Kitchen PANTOPRAZOLE SODIUM 40 MG PO TBEC Oral Take 40 mg by mouth daily.    .  TRIAMTERENE-HCTZ 37.5-25 MG PO TABS Oral Take 0.5 tablets by mouth daily.      BP 121/93  Pulse 84  Temp 98.8 F (37.1 C) (Oral)  Resp 18  SpO2 99%  Physical Exam  Nursing note and vitals reviewed. Constitutional: He appears well-developed and well-nourished. No distress.  HENT:  Head: Normocephalic and atraumatic.  Right Ear: External ear normal.  Left Ear: External ear normal.  Eyes: Conjunctivae are normal. Right eye exhibits no discharge. Left eye exhibits no discharge. No scleral icterus.  Neck: Neck supple. No tracheal deviation present.  Cardiovascular: Normal rate, regular rhythm and intact distal pulses.   Pulmonary/Chest: Effort normal and breath sounds normal. No stridor. No respiratory distress. He has no wheezes. He has no rales.  Abdominal: Soft. Bowel sounds are normal. He exhibits no distension. There is no tenderness. There is no rebound and no guarding.  Musculoskeletal: He exhibits no edema and no tenderness.  Neurological: He is alert. He has normal strength. No sensory deficit. Cranial nerve deficit:  no gross defecits noted. He exhibits normal muscle tone. He displays no seizure activity. Coordination normal.  Skin: Skin is warm and dry. No rash noted.  Psychiatric: His speech is not rapid and/or pressured. He is withdrawn.  He is not agitated and not aggressive. He exhibits a depressed mood. He expresses suicidal ideation. He is noncommunicative.       Depressed mood    ED Course  Procedures (including critical care time)  Labs Reviewed  CBC - Abnormal; Notable for the following:    Hemoglobin 18.6 (*)     MCHC 37.3 (*)     All other components within normal limits  URINE RAPID DRUG SCREEN (HOSP PERFORMED) - Abnormal; Notable for the following:    Benzodiazepines POSITIVE (*)     All other components within normal limits  COMPREHENSIVE METABOLIC PANEL  ETHANOL  ACETAMINOPHEN LEVEL   No results found.   No diagnosis found.    MDM  Labs are  pending however the patient appears medically stable.Marland Kitchen   He will be evaluated in the act team for possible psychiatric admission       Celene Kras, MD 07/31/12 [phone]

## 2012-07-31 NOTE — ED Notes (Signed)
Pt's mom took all belongings home.

## 2012-08-04 ENCOUNTER — Other Ambulatory Visit: Payer: Self-pay | Admitting: Neurosurgery

## 2012-08-04 DIAGNOSIS — M549 Dorsalgia, unspecified: Secondary | ICD-10-CM

## 2012-08-04 DIAGNOSIS — M5410 Radiculopathy, site unspecified: Secondary | ICD-10-CM

## 2012-08-05 ENCOUNTER — Ambulatory Visit
Admission: RE | Admit: 2012-08-05 | Discharge: 2012-08-05 | Disposition: A | Discharge status: Home / Self Care | Payer: BC Managed Care – PPO | Source: Ambulatory Visit | Attending: Neurosurgery | Admitting: Neurosurgery

## 2012-08-05 DIAGNOSIS — M549 Dorsalgia, unspecified: Secondary | ICD-10-CM

## 2012-08-05 DIAGNOSIS — M5410 Radiculopathy, site unspecified: Secondary | ICD-10-CM

## 2012-08-05 MED ORDER — IOHEXOL 180 MG/ML  SOLN
1.0000 mL | Freq: Once | INTRAMUSCULAR | Status: AC | PRN
  Administered 2012-08-05: 1 mL via EPIDURAL

## 2012-08-05 MED ORDER — METHYLPREDNISOLONE ACETATE 40 MG/ML INJ SUSP (RADIOLOG
120.0000 mg | Freq: Once | INTRAMUSCULAR | Status: AC
  Administered 2012-08-05: 120 mg via EPIDURAL

## 2012-08-05 NOTE — Discharge Instructions (Signed)

## 2012-08-10 NOTE — Progress Notes (Signed)
Observation review is complete for the 07/23/2012 visit.

## 2015-01-01 ENCOUNTER — Emergency Department (HOSPITAL_COMMUNITY)
Admission: EM | Admit: 2015-01-01 | Discharge: 2015-01-01 | Disposition: A | Discharge status: Home / Self Care | Payer: BLUE CROSS/BLUE SHIELD | Attending: Emergency Medicine | Admitting: Emergency Medicine

## 2015-01-01 ENCOUNTER — Encounter (HOSPITAL_COMMUNITY): Payer: Self-pay | Admitting: Emergency Medicine

## 2015-01-01 DIAGNOSIS — K219 Gastro-esophageal reflux disease without esophagitis: Secondary | ICD-10-CM | POA: Insufficient documentation

## 2015-01-01 DIAGNOSIS — I10 Essential (primary) hypertension: Secondary | ICD-10-CM | POA: Diagnosis not present

## 2015-01-01 DIAGNOSIS — R739 Hyperglycemia, unspecified: Secondary | ICD-10-CM | POA: Diagnosis present

## 2015-01-01 DIAGNOSIS — E139 Other specified diabetes mellitus without complications: Secondary | ICD-10-CM | POA: Insufficient documentation

## 2015-01-01 DIAGNOSIS — Z79899 Other long term (current) drug therapy: Secondary | ICD-10-CM | POA: Diagnosis not present

## 2015-01-01 DIAGNOSIS — I2510 Atherosclerotic heart disease of native coronary artery without angina pectoris: Secondary | ICD-10-CM | POA: Insufficient documentation

## 2015-01-01 HISTORY — DX: Other pneumothorax: J93.83

## 2015-01-01 LAB — COMPREHENSIVE METABOLIC PANEL
ALBUMIN: 4.1 g/dL (ref 3.5–5.2)
ALT: 28 U/L (ref 0–53)
AST: 23 U/L (ref 0–37)
Alkaline Phosphatase: 73 U/L (ref 39–117)
Anion gap: 10 (ref 5–15)
BUN: 10 mg/dL (ref 6–23)
CHLORIDE: 95 mmol/L — AB (ref 96–112)
CO2: 26 mmol/L (ref 19–32)
CREATININE: 0.92 mg/dL (ref 0.50–1.35)
Calcium: 9.2 mg/dL (ref 8.4–10.5)
GFR calc Af Amer: >90 mL/min (ref >90)
GFR calc non Af Amer: >90 mL/min (ref >90)
Glucose, Bld: 385 mg/dL — ABNORMAL HIGH (ref 70–99)
POTASSIUM: 3.3 mmol/L — AB (ref 3.5–5.1)
Sodium: 131 mmol/L — ABNORMAL LOW (ref 135–145)
Total Bilirubin: 2 mg/dL — ABNORMAL HIGH (ref 0.3–1.2)
Total Protein: 6.8 g/dL (ref 6.0–8.3)

## 2015-01-01 LAB — URINE MICROSCOPIC-ADD ON

## 2015-01-01 LAB — CBC WITH DIFFERENTIAL/PLATELET
BASOS ABS: 0.1 10*3/uL (ref 0.0–0.1)
Basophils Relative: 1 % (ref 0–1)
EOS ABS: 0.1 10*3/uL (ref 0.0–0.7)
EOS PCT: 2 % (ref 0–5)
HEMATOCRIT: 43.7 % (ref 39.0–52.0)
HEMOGLOBIN: 16.7 g/dL (ref 13.0–17.0)
LYMPHS PCT: 24 % (ref 12–46)
Lymphs Abs: 1.9 10*3/uL (ref 0.7–4.0)
MCH: 32.1 pg (ref 26.0–34.0)
MCHC: 38.2 g/dL — AB (ref 30.0–36.0)
MCV: 83.9 fL (ref 78.0–100.0)
MONOS PCT: 7 % (ref 3–12)
Monocytes Absolute: 0.5 10*3/uL (ref 0.1–1.0)
Neutro Abs: 5.2 10*3/uL (ref 1.7–7.7)
Neutrophils Relative %: 67 % (ref 43–77)
Platelets: 220 10*3/uL (ref 150–400)
RBC: 5.21 MIL/uL (ref 4.22–5.81)
RDW: 11.8 % (ref 11.5–15.5)
WBC: 7.8 10*3/uL (ref 4.0–10.5)

## 2015-01-01 LAB — URINALYSIS, ROUTINE W REFLEX MICROSCOPIC
BILIRUBIN URINE: NEGATIVE
Glucose, UA: >1000 mg/dL — AB
HGB URINE DIPSTICK: NEGATIVE
LEUKOCYTES UA: NEGATIVE
Nitrite: NEGATIVE
PROTEIN: NEGATIVE mg/dL
Specific Gravity, Urine: 1.041 — ABNORMAL HIGH (ref 1.005–1.030)
UROBILINOGEN UA: 0.2 mg/dL (ref 0.0–1.0)
pH: 5.5 (ref 5.0–8.0)

## 2015-01-01 LAB — CBG MONITORING, ED
GLUCOSE-CAPILLARY: 291 mg/dL — AB (ref 70–99)
Glucose-Capillary: 333 mg/dL — ABNORMAL HIGH (ref 70–99)
Glucose-Capillary: 280 mg/dL — ABNORMAL HIGH (ref 70–99)

## 2015-01-01 MED ORDER — METFORMIN HCL 500 MG PO TABS
500.0000 mg | ORAL_TABLET | Freq: Once | ORAL | Status: AC
  Administered 2015-01-01: 500 mg via ORAL
  Filled 2015-01-01: qty 1

## 2015-01-01 MED ORDER — POTASSIUM CHLORIDE CRYS ER 20 MEQ PO TBCR
40.0000 meq | EXTENDED_RELEASE_TABLET | Freq: Once | ORAL | Status: AC
  Administered 2015-01-01: 40 meq via ORAL
  Filled 2015-01-01: qty 2

## 2015-01-01 MED ORDER — METFORMIN HCL 500 MG PO TABS: 500.0000 mg | ORAL_TABLET | Freq: Two times a day (BID) | ORAL | Status: DC

## 2015-01-01 MED ORDER — SODIUM CHLORIDE 0.9 % IV BOLUS (SEPSIS)
1000.0000 mL | Freq: Once | INTRAVENOUS | Status: AC
  Administered 2015-01-01: 1000 mL via INTRAVENOUS

## 2015-01-01 MED ORDER — POTASSIUM CHLORIDE CRYS ER 20 MEQ PO TBCR: 20.0000 meq | EXTENDED_RELEASE_TABLET | Freq: Every day | ORAL | Status: DC

## 2015-01-01 NOTE — ED Notes (Signed)
Pt went to urgent care this afternoon, has been having trouble sleeping, losing weight, frequent urination and excessive thirst x 1 month. UC told patient that his blood sugar was unreadable on their machine and sent patient here for further evaluation.

## 2015-01-01 NOTE — ED Notes (Signed)
MD at bedside. 

## 2015-01-01 NOTE — Discharge Instructions (Signed)
Diabetes Mellitus and Food °It is important for you to manage your blood sugar (glucose) level. Your blood glucose level can be greatly affected by what you eat. Eating healthier foods in the appropriate amounts throughout the day at about the same time each day will help you control your blood glucose level. It can also help slow or prevent worsening of your diabetes mellitus. Healthy eating may even help you improve the level of your blood pressure and reach or maintain a healthy weight.  °HOW CAN FOOD AFFECT ME? °Carbohydrates °Carbohydrates affect your blood glucose level more than any other type of food. Your dietitian will help you determine how many carbohydrates to eat at each meal and teach you how to count carbohydrates. Counting carbohydrates is important to keep your blood glucose at a healthy level, especially if you are using insulin or taking certain medicines for diabetes mellitus. °Alcohol °Alcohol can cause sudden decreases in blood glucose (hypoglycemia), especially if you use insulin or take certain medicines for diabetes mellitus. Hypoglycemia can be a life-threatening condition. Symptoms of hypoglycemia (sleepiness, dizziness, and disorientation) are similar to symptoms of having too much alcohol.  °If your health care provider has given you approval to drink alcohol, do so in moderation and use the following guidelines: °· Women should not have more than one drink per day, and men should not have more than two drinks per day. One drink is equal to: °¨ 12 oz of beer. °¨ 5 oz of wine. °¨ 1½ oz of hard liquor. °· Do not drink on an empty stomach. °· Keep yourself hydrated. Have water, diet soda, or unsweetened iced tea. °· Regular soda, juice, and other mixers might contain a lot of carbohydrates and should be counted. °WHAT FOODS ARE NOT RECOMMENDED? °As you make food choices, it is important to remember that all foods are not the same. Some foods have fewer nutrients per serving than other  foods, even though they might have the same number of calories or carbohydrates. It is difficult to get your body what it needs when you eat foods with fewer nutrients. Examples of foods that you should avoid that are high in calories and carbohydrates but low in nutrients include: °· Trans fats (most processed foods list trans fats on the Nutrition Facts label). °· Regular soda. °· Juice. °· Candy. °· Sweets, such as cake, pie, doughnuts, and cookies. °· Fried foods. °WHAT FOODS CAN I EAT? °Have nutrient-rich foods, which will nourish your body and keep you healthy. The food you should eat also will depend on several factors, including: °· The calories you need. °· The medicines you take. °· Your weight. °· Your blood glucose level. °· Your blood pressure level. °· Your cholesterol level. °You also should eat a variety of foods, including: °· Protein, such as meat, poultry, fish, tofu, nuts, and seeds (lean animal proteins are best). °· Fruits. °· Vegetables. °· Dairy products, such as milk, cheese, and yogurt (low fat is best). °· Breads, grains, pasta, cereal, rice, and beans. °· Fats such as olive oil, trans fat-free margarine, canola oil, avocado, and olives. °DOES EVERYONE WITH DIABETES MELLITUS HAVE THE SAME MEAL PLAN? °Because every person with diabetes mellitus is different, there is not one meal plan that works for everyone. It is very important that you meet with a dietitian who will help you create a meal plan that is just right for you. °Document Released: 08/15/2005 Document Revised: 11/23/2013 Document Reviewed: 10/15/2013 °Exit[employer]® Patient Information ©2015 [employer]. This   information is not intended to replace advice given to you by your health care provider. Make sure you discuss any questions you have with your health care provider. ° °Hyperglycemia °Hyperglycemia occurs when the glucose (sugar) in your blood is too high. Hyperglycemia can happen for many reasons, but it most often happens to  people who do not know they have diabetes or are not managing their diabetes properly.  °CAUSES  °Whether you have diabetes or not, there are other causes of hyperglycemia. Hyperglycemia can occur when you have diabetes, but it can also occur in other situations that you might not be as aware of, such as: °Diabetes °· If you have diabetes and are having problems controlling your blood glucose, hyperglycemia could occur because of some of the following reasons: °¨ Not following your meal plan. °¨ Not taking your diabetes medications or not taking it properly. °¨ Exercising less or doing less activity than you normally do. °¨ Being sick. °Pre-diabetes °· This cannot be ignored. Before people develop Type 2 diabetes, they almost always have "pre-diabetes." This is when your blood glucose levels are higher than normal, but not yet high enough to be diagnosed as diabetes. Research has shown that some long-term damage to the body, especially the heart and circulatory system, may already be occurring during pre-diabetes. If you take action to manage your blood glucose when you have pre-diabetes, you may delay or prevent Type 2 diabetes from developing. °Stress °· If you have diabetes, you may be "diet" controlled or on oral medications or insulin to control your diabetes. However, you may find that your blood glucose is higher than usual in the hospital whether you have diabetes or not. This is often referred to as "stress hyperglycemia." Stress can elevate your blood glucose. This happens because of hormones put out by the body during times of stress. If stress has been the cause of your high blood glucose, it can be followed regularly by your caregiver. That way he/she can make sure your hyperglycemia does not continue to get worse or progress to diabetes. °Steroids °· Steroids are medications that act on the infection fighting system (immune system) to block inflammation or infection. One side effect can be a rise in  blood glucose. Most people can produce enough extra insulin to allow for this rise, but for those who cannot, steroids make blood glucose levels go even higher. It is not unusual for steroid treatments to "uncover" diabetes that is developing. It is not always possible to determine if the hyperglycemia will go away after the steroids are stopped. A special blood test called an A1c is sometimes done to determine if your blood glucose was elevated before the steroids were started. °SYMPTOMS °· Thirsty. °· Frequent urination. °· Dry mouth. °· Blurred vision. °· Tired or fatigue. °· Weakness. °· Sleepy. °· Tingling in feet or leg. °DIAGNOSIS  °Diagnosis is made by monitoring blood glucose in one or all of the following ways: °· A1c test. This is a chemical found in your blood. °· Fingerstick blood glucose monitoring. °· Laboratory results. °TREATMENT  °First, knowing the cause of the hyperglycemia is important before the hyperglycemia can be treated. Treatment may include, but is not be limited to: °· Education. °· Change or adjustment in medications. °· Change or adjustment in meal plan. °· Treatment for an illness, infection, etc. °· More frequent blood glucose monitoring. °· Change in exercise plan. °· Decreasing or stopping steroids. °· Lifestyle changes. °HOME CARE INSTRUCTIONS  °· Test your   blood glucose as directed. °· Exercise regularly. Your caregiver will give you instructions about exercise. Pre-diabetes or diabetes which comes on with stress is helped by exercising. °· Eat wholesome, balanced meals. Eat often and at regular, fixed times. Your caregiver or nutritionist will give you a meal plan to guide your sugar intake. °· Being at an ideal weight is important. If needed, losing as little as 10 to 15 pounds may help improve blood glucose levels. °SEEK MEDICAL CARE IF:  °· You have questions about medicine, activity, or diet. °· You continue to have symptoms (problems such as increased thirst, urination, or  weight gain). °SEEK IMMEDIATE MEDICAL CARE IF:  °· You are vomiting or have diarrhea. °· Your breath smells fruity. °· You are breathing faster or slower. °· You are very sleepy or incoherent. °· You have numbness, tingling, or pain in your feet or hands. °· You have chest pain. °· Your symptoms get worse even though you have been following your caregiver's orders. °· If you have any other questions or concerns. °Document Released: 05/14/2001 Document Revised: 02/10/2012 Document Reviewed: 03/16/2012 °Exit[employer]® Patient Information ©2015 [employer]. This information is not intended to replace advice given to you by your health care provider. Make sure you discuss any questions you have with your health care provider. ° °

## 2015-01-01 NOTE — ED Notes (Signed)
Pt undressed, in gown, on continuous pulse oximetry and blood pressure cuff 

## 2015-01-01 NOTE — ED Provider Notes (Signed)
CSN: [id number]     Arrival date & time 01/01/15  1806 History   First MD Initiated Contact with Patient 01/01/15 1819     Chief Complaint  Patient presents with  . Hyperglycemia      HPI Pt went to urgent care this afternoon, has been having trouble sleeping, losing weight, frequent urination and excessive thirst x 1 month. UC told patient that his blood sugar was unreadable on their machine and sent patient here for further evaluation.  Patient's father had adult onset diabetes at the age of 52. Past Medical History  Diagnosis Date  . Coronary artery disease   . Hypertension   . GERD (gastroesophageal reflux disease)   . Raynaud disease   . Pneumothorax, acute    Past Surgical History  Procedure Laterality Date  . Back surgery     History reviewed. No pertinent family history. History  Substance Use Topics  . Smoking status: Never Smoker   . Smokeless tobacco: Not on file  . Alcohol Use: No    Review of Systems  Constitutional: Positive for activity change and unexpected weight change.  Endocrine: Positive for polydipsia and polyuria.  All other systems reviewed and are negative.    Allergies  Review of patient's allergies indicates no known allergies.  Home Medications   Prior to Admission medications   Medication Sig Start Date End Date Taking? Authorizing Provider  atorvastatin (LIPITOR) 20 MG tablet Take 10 mg by mouth daily.   Yes Historical Provider, MD  fish oil-omega-3 fatty acids 1000 MG capsule Take 1 g by mouth daily.   Yes Historical Provider, MD  Hyprom-Naphaz-Polysorb-Zn Sulf (CLEAR EYES COMPLETE OP) Place 1 drop into both eyes daily as needed.   Yes Historical Provider, MD  Multiple Vitamin (MULTIVITAMIN WITH MINERALS) TABS Take 1 tablet by mouth daily.   Yes Historical Provider, MD  pantoprazole (PROTONIX) 40 MG tablet Take 40 mg by mouth daily. 07/07/12  Yes Peyton Najjaravid H Hopper, MD  triamterene-hydrochlorothiazide (MAXZIDE-25) 37.5-25 MG per tablet Take  0.5 tablets by mouth daily.   Yes Historical Provider, MD  metFORMIN (GLUCOPHAGE) 500 MG tablet Take 1 tablet (500 mg total) by mouth 2 (two) times daily with a meal. 01/01/15   Nelia Shiobert L Mikiya Nebergall, MD  potassium chloride SA (K-DUR,KLOR-CON) 20 MEQ tablet Take 1 tablet (20 mEq total) by mouth daily. 01/01/15   Nelia Shiobert L Ziya Coonrod, MD   BP 125/81 mmHg  Pulse 66  Temp(Src) 98.3 F (36.8 C) (Oral)  Resp 14  SpO2 98% Physical Exam Physical Exam  Nursing note and vitals reviewed. Constitutional: He is oriented to person, place, and time. He appears well-developed and well-nourished. No distress.  HENT:  Head: Normocephalic and atraumatic.  Eyes: Pupils are equal, round, and reactive to light.  Neck: Normal range of motion.  Cardiovascular: Normal rate and intact distal pulses.   Pulmonary/Chest: No respiratory distress.  Abdominal: Normal appearance. He exhibits no distension.  Musculoskeletal: Normal range of motion.  Neurological: He is alert and oriented to person, place, and time. No cranial nerve deficit.  Skin: Skin is warm and dry. No rash noted.  Psychiatric: He has a normal mood and affect. His behavior is normal.   ED Course  Procedures (including critical care time) Medications  sodium chloride 0.9 % bolus 1,000 mL (0 mLs Intravenous Stopped 01/01/15 1950)  potassium chloride SA (K-DUR,KLOR-CON) CR tablet 40 mEq (40 mEq Oral Given 01/01/15 2034)  sodium chloride 0.9 % bolus 1,000 mL (1,000 mLs Intravenous New Bag/Given  01/01/15 2035)  metFORMIN (GLUCOPHAGE) tablet 500 mg (500 mg Oral Given 01/01/15 2132)    Labs Review Labs Reviewed  COMPREHENSIVE METABOLIC PANEL - Abnormal; Notable for the following:    Sodium 131 (*)    Potassium 3.3 (*)    Chloride 95 (*)    Glucose, Bld 385 (*)    Total Bilirubin 2.0 (*)    All other components within normal limits  CBC WITH DIFFERENTIAL/PLATELET - Abnormal; Notable for the following:    MCHC 38.2 (*)    All other components within normal  limits  URINALYSIS, ROUTINE W REFLEX MICROSCOPIC - Abnormal; Notable for the following:    Specific Gravity, Urine 1.041 (*)    Glucose, UA >1000 (*)    Ketones, ur >80 (*)    All other components within normal limits  CBG MONITORING, ED - Abnormal; Notable for the following:    Glucose-Capillary 333 (*)    All other components within normal limits  CBG MONITORING, ED - Abnormal; Notable for the following:    Glucose-Capillary 291 (*)    All other components within normal limits  CBG MONITORING, ED - Abnormal; Notable for the following:    Glucose-Capillary 280 (*)    All other components within normal limits  URINE MICROSCOPIC-ADD ON  HEMOGLOBIN A1C    Imaging Review No results found.  After treatment in the ED the patient feels back to baseline and wants to go home.  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

## 2015-01-03 LAB — HEMOGLOBIN A1C
HEMOGLOBIN A1C: 14.9 % — AB (ref 4.8–5.6)
Mean Plasma Glucose: 381 mg/dL

## 2015-04-21 ENCOUNTER — Emergency Department (HOSPITAL_COMMUNITY)
Admission: EM | Admit: 2015-04-21 | Discharge: 2015-04-21 | Disposition: A | Discharge status: Home / Self Care | Payer: BLUE CROSS/BLUE SHIELD | Attending: Emergency Medicine | Admitting: Emergency Medicine

## 2015-04-21 ENCOUNTER — Encounter (HOSPITAL_COMMUNITY): Payer: Self-pay | Admitting: Neurology

## 2015-04-21 ENCOUNTER — Emergency Department (HOSPITAL_COMMUNITY): Payer: BLUE CROSS/BLUE SHIELD

## 2015-04-21 DIAGNOSIS — R110 Nausea: Secondary | ICD-10-CM | POA: Insufficient documentation

## 2015-04-21 DIAGNOSIS — R197 Diarrhea, unspecified: Secondary | ICD-10-CM | POA: Diagnosis not present

## 2015-04-21 DIAGNOSIS — R1031 Right lower quadrant pain: Secondary | ICD-10-CM | POA: Diagnosis present

## 2015-04-21 DIAGNOSIS — R630 Anorexia: Secondary | ICD-10-CM | POA: Insufficient documentation

## 2015-04-21 DIAGNOSIS — Z79899 Other long term (current) drug therapy: Secondary | ICD-10-CM | POA: Diagnosis not present

## 2015-04-21 DIAGNOSIS — I10 Essential (primary) hypertension: Secondary | ICD-10-CM | POA: Insufficient documentation

## 2015-04-21 DIAGNOSIS — I2510 Atherosclerotic heart disease of native coronary artery without angina pectoris: Secondary | ICD-10-CM | POA: Insufficient documentation

## 2015-04-21 DIAGNOSIS — Z8709 Personal history of other diseases of the respiratory system: Secondary | ICD-10-CM | POA: Insufficient documentation

## 2015-04-21 DIAGNOSIS — K219 Gastro-esophageal reflux disease without esophagitis: Secondary | ICD-10-CM | POA: Diagnosis not present

## 2015-04-21 DIAGNOSIS — R1084 Generalized abdominal pain: Secondary | ICD-10-CM | POA: Insufficient documentation

## 2015-04-21 DIAGNOSIS — E860 Dehydration: Secondary | ICD-10-CM | POA: Insufficient documentation

## 2015-04-21 HISTORY — DX: Type 2 diabetes mellitus without complications: E11.9

## 2015-04-21 LAB — COMPREHENSIVE METABOLIC PANEL
ALT: 23 U/L (ref 17–63)
AST: 18 U/L (ref 15–41)
Albumin: 4.6 g/dL (ref 3.5–5.0)
Alkaline Phosphatase: 42 U/L (ref 38–126)
Anion gap: 10 (ref 5–15)
BILIRUBIN TOTAL: 1 mg/dL (ref 0.3–1.2)
BUN: 16 mg/dL (ref 6–20)
CALCIUM: 9.8 mg/dL (ref 8.9–10.3)
CO2: 24 mmol/L (ref 22–32)
CREATININE: 0.88 mg/dL (ref 0.61–1.24)
Chloride: 104 mmol/L (ref 101–111)
GFR calc Af Amer: >60 mL/min (ref >60)
GFR calc non Af Amer: >60 mL/min (ref >60)
GLUCOSE: 154 mg/dL — AB (ref 65–99)
Potassium: 3.6 mmol/L (ref 3.5–5.1)
SODIUM: 138 mmol/L (ref 135–145)
TOTAL PROTEIN: 7.7 g/dL (ref 6.5–8.1)

## 2015-04-21 LAB — URINALYSIS, ROUTINE W REFLEX MICROSCOPIC
Bilirubin Urine: NEGATIVE
KETONES UR: 15 mg/dL — AB
Leukocytes, UA: NEGATIVE
Nitrite: NEGATIVE
PH: 6 (ref 5.0–8.0)
PROTEIN: NEGATIVE mg/dL
Specific Gravity, Urine: >1.03 — ABNORMAL HIGH (ref 1.005–1.030)
Urobilinogen, UA: 0.2 mg/dL (ref 0.0–1.0)

## 2015-04-21 LAB — URINE MICROSCOPIC-ADD ON

## 2015-04-21 LAB — CBC WITH DIFFERENTIAL/PLATELET
BASOS ABS: 0 10*3/uL (ref 0.0–0.1)
Basophils Relative: 0 % (ref 0–1)
Eosinophils Absolute: 0.1 10*3/uL (ref 0.0–0.7)
Eosinophils Relative: 1 % (ref 0–5)
HEMATOCRIT: 50.3 % (ref 39.0–52.0)
HEMOGLOBIN: 17.9 g/dL — AB (ref 13.0–17.0)
LYMPHS ABS: 1.4 10*3/uL (ref 0.7–4.0)
LYMPHS PCT: 13 % (ref 12–46)
MCH: 31.7 pg (ref 26.0–34.0)
MCHC: 35.6 g/dL (ref 30.0–36.0)
MCV: 89.2 fL (ref 78.0–100.0)
MONO ABS: 0.7 10*3/uL (ref 0.1–1.0)
Monocytes Relative: 6 % (ref 3–12)
NEUTROS ABS: 9.3 10*3/uL — AB (ref 1.7–7.7)
Neutrophils Relative %: 80 % — ABNORMAL HIGH (ref 43–77)
Platelets: 251 10*3/uL (ref 150–400)
RBC: 5.64 MIL/uL (ref 4.22–5.81)
RDW: 11.9 % (ref 11.5–15.5)
WBC: 11.5 10*3/uL — AB (ref 4.0–10.5)

## 2015-04-21 LAB — LIPASE, BLOOD: LIPASE: 23 U/L (ref 22–51)

## 2015-04-21 MED ORDER — SODIUM CHLORIDE 0.9 % IV BOLUS (SEPSIS)
1000.0000 mL | Freq: Once | INTRAVENOUS | Status: AC
  Administered 2015-04-21: 1000 mL via INTRAVENOUS

## 2015-04-21 MED ORDER — METRONIDAZOLE 500 MG PO TABS: 500.0000 mg | ORAL_TABLET | Freq: Two times a day (BID) | ORAL | Status: DC

## 2015-04-21 MED ORDER — ONDANSETRON HCL 4 MG/2ML IJ SOLN
4.0000 mg | Freq: Once | INTRAMUSCULAR | Status: AC
  Administered 2015-04-21: 4 mg via INTRAVENOUS
  Filled 2015-04-21: qty 2

## 2015-04-21 MED ORDER — FENTANYL CITRATE (PF) 100 MCG/2ML IJ SOLN
100.0000 ug | Freq: Once | INTRAMUSCULAR | Status: AC
  Administered 2015-04-21: 100 ug via INTRAVENOUS
  Filled 2015-04-21: qty 2

## 2015-04-21 MED ORDER — CIPROFLOXACIN HCL 500 MG PO TABS: 500.0000 mg | ORAL_TABLET | Freq: Two times a day (BID) | ORAL | Status: DC

## 2015-04-21 MED ORDER — IOHEXOL 300 MG/ML  SOLN
25.0000 mL | Freq: Once | INTRAMUSCULAR | Status: AC | PRN
  Administered 2015-04-21: 25 mL via ORAL

## 2015-04-21 MED ORDER — IOHEXOL 300 MG/ML  SOLN
80.0000 mL | Freq: Once | INTRAMUSCULAR | Status: AC | PRN
  Administered 2015-04-21: 80 mL via INTRAVENOUS

## 2015-04-21 NOTE — Discharge Instructions (Signed)
As discussed, your evaluation today has been largely reassuring.  But, it is important that you monitor your condition carefully, and do not hesitate to return to the ED if you develop new, or concerning changes in your condition.  Recall that if you develop new fever, persistent vomiting, or worsening abdominal pain, please be sure to fill the antibiotic prescriptions.  Please be sure to follow-up with your gastroenterologist next week.   As directed, you need to stop taking your Invokamet for two days.

## 2015-04-21 NOTE — ED Provider Notes (Signed)
Patient states that he feels better. I discussed all findings with him and multiple family members. He states that his pain is diminished. Given his history of diverticulitis, his disruption pain that was similar to that earlier today, the patient was provided prescriptions to be filled should he develop fever, worsening condition, prior to seeing his gastroenterologist next week. Return precautions provided as well.   Gerhard Munchobert Calix Heinbaugh, MD 04/21/15 [phone]

## 2015-04-21 NOTE — ED Provider Notes (Signed)
CSN: [id number]     Arrival date & time 04/21/15  1159 History   First MD Initiated Contact with Patient 04/21/15 1320     Chief Complaint  Patient presents with  . Abdominal Pain     (Consider location/radiation/quality/duration/timing/severity/associated sxs/prior Treatment) Patient is a 54 y.o. male presenting with abdominal pain. The history is provided by the patient.  Abdominal Pain Pain location:  RLQ Associated symptoms: diarrhea and nausea   Associated symptoms: no chest pain, no fever, no shortness of breath and no vomiting   patient with Nausea and diarrhea for the last couple days. States feels dehydrated. He also has had some abdominal pain. It is in his lower abdomen. States it was severe last night. States that he was seen at an urgent care and was told he had a very elevated white count. It appears to be 12. He was sent in for further evaluation. No dysuria. No fevers. He has had a decreased appetite. The pain is dull and constant.  Past Medical History  Diagnosis Date  . Coronary artery disease   . Hypertension   . GERD (gastroesophageal reflux disease)   . Raynaud disease   . Pneumothorax, acute    Past Surgical History  Procedure Laterality Date  . Back surgery     No family history on file. History  Substance Use Topics  . Smoking status: Never Smoker   . Smokeless tobacco: Not on file  . Alcohol Use: No    Review of Systems  Constitutional: Positive for appetite change. Negative for fever and activity change.  Eyes: Negative for pain.  Respiratory: Negative for chest tightness and shortness of breath.   Cardiovascular: Negative for chest pain and leg swelling.  Gastrointestinal: Positive for nausea, abdominal pain and diarrhea. Negative for vomiting.  Genitourinary: Negative for flank pain.  Musculoskeletal: Negative for back pain and neck stiffness.  Skin: Negative for rash.  Neurological: Negative for weakness, numbness and headaches.   Psychiatric/Behavioral: Negative for behavioral problems.      Allergies  Review of patient's allergies indicates no known allergies.  Home Medications   Prior to Admission medications   Medication Sig Start Date End Date Taking? Authorizing Provider  atorvastatin (LIPITOR) 20 MG tablet Take 10 mg by mouth daily.    Historical Provider, MD  fish oil-omega-3 fatty acids 1000 MG capsule Take 1 g by mouth daily.    Historical Provider, MD  Hyprom-Naphaz-Polysorb-Zn Sulf (CLEAR EYES COMPLETE OP) Place 1 drop into both eyes daily as needed.    Historical Provider, MD  metFORMIN (GLUCOPHAGE) 500 MG tablet Take 1 tablet (500 mg total) by mouth 2 (two) times daily with a meal. 01/01/15   Nelva Nayobert Beaton, MD  Multiple Vitamin (MULTIVITAMIN WITH MINERALS) TABS Take 1 tablet by mouth daily.    Historical Provider, MD  pantoprazole (PROTONIX) 40 MG tablet Take 40 mg by mouth daily. 07/07/12   Peyton Najjaravid H Hopper, MD  potassium chloride SA (K-DUR,KLOR-CON) 20 MEQ tablet Take 1 tablet (20 mEq total) by mouth daily. 01/01/15   Nelva Nayobert Beaton, MD  triamterene-hydrochlorothiazide (MAXZIDE-25) 37.5-25 MG per tablet Take 0.5 tablets by mouth daily.    Historical Provider, MD   BP 140/91 mmHg  Pulse 65  Temp(Src) 98.3 F (36.8 C) (Oral)  Resp 16  SpO2 100% Physical Exam  Constitutional: He is oriented to person, place, and time. He appears well-developed and well-nourished.  HENT:  Head: Normocephalic and atraumatic.  Eyes: EOM are normal. Pupils are equal, round, and reactive  to light.  Neck: Normal range of motion. Neck supple.  Cardiovascular: Normal rate, regular rhythm and normal heart sounds.   No murmur heard. Pulmonary/Chest: Effort normal and breath sounds normal.  Abdominal: Soft. Bowel sounds are normal. He exhibits no distension and no mass. There is tenderness. There is no rebound and no guarding.  Patient with abdominal tenderness in the right lower quadrant and superior area. No hernias.  Rebound or guarding.  Musculoskeletal: Normal range of motion. He exhibits no edema.  Neurological: He is alert and oriented to person, place, and time. No cranial nerve deficit.  Skin: Skin is warm and dry.  Psychiatric: He has a normal mood and affect.  Nursing note and vitals reviewed.   ED Course  Procedures (including critical care time) Labs Review Labs Reviewed  CBC WITH DIFFERENTIAL/PLATELET - Abnormal; Notable for the following:    WBC 11.5 (*)    Hemoglobin 17.9 (*)    Neutrophils Relative % 80 (*)    Neutro Abs 9.3 (*)    All other components within normal limits  COMPREHENSIVE METABOLIC PANEL - Abnormal; Notable for the following:    Glucose, Bld 154 (*)    All other components within normal limits  LIPASE, BLOOD  URINALYSIS, ROUTINE W REFLEX MICROSCOPIC    Imaging Review No results found.   EKG Interpretation None      MDM   Final diagnoses:  None    Patient with abdominal pain. Has had nausea and diarrhea. Lab work shows minimally elevated white count. CT scan pending.    Benjiman CoreNathan Kimmi Acocella, MD 04/21/15 [phone]

## 2015-04-21 NOTE — ED Notes (Signed)
Pt reports abd pain, diarrhea since last night. No vomiting. [facility] [place], elevated WBC. Sent here for CT scan.

## 2015-04-21 NOTE — ED Notes (Signed)
Pt transported to radiology.

## 2016-04-19 IMAGING — CT CT ABD-PELV W/ CM
2 of 5 series · 4 of 46 positions shown, 5 images · IV contrast (Iodine)
Comparison: 01/13/2009

CLINICAL DATA: Right-sided abdominal pain with nausea and vomiting

EXAM:
CT ABDOMEN AND PELVIS WITH CONTRAST
TECHNIQUE: Multidetector CT imaging of the abdomen and pelvis was performed
using the standard protocol following bolus administration of
intravenous contrast.
CONTRAST:  80mL OMNIPAQUE IOHEXOL 300 MG/ML  SOLN

[Series 204: cor · coronal · 0.50mm/px · 3 of 133 slices shown, 4 images]
[im 30/133  soft-tissue]
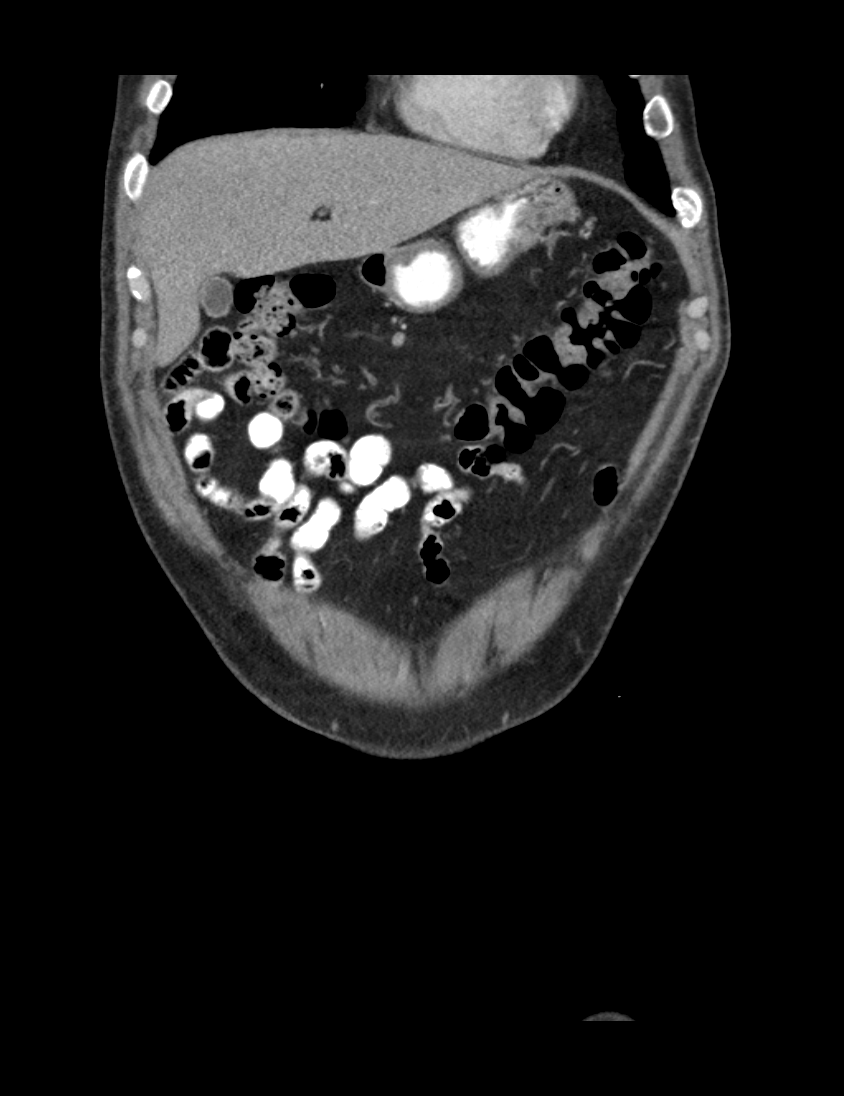
[im 30/133  bone]
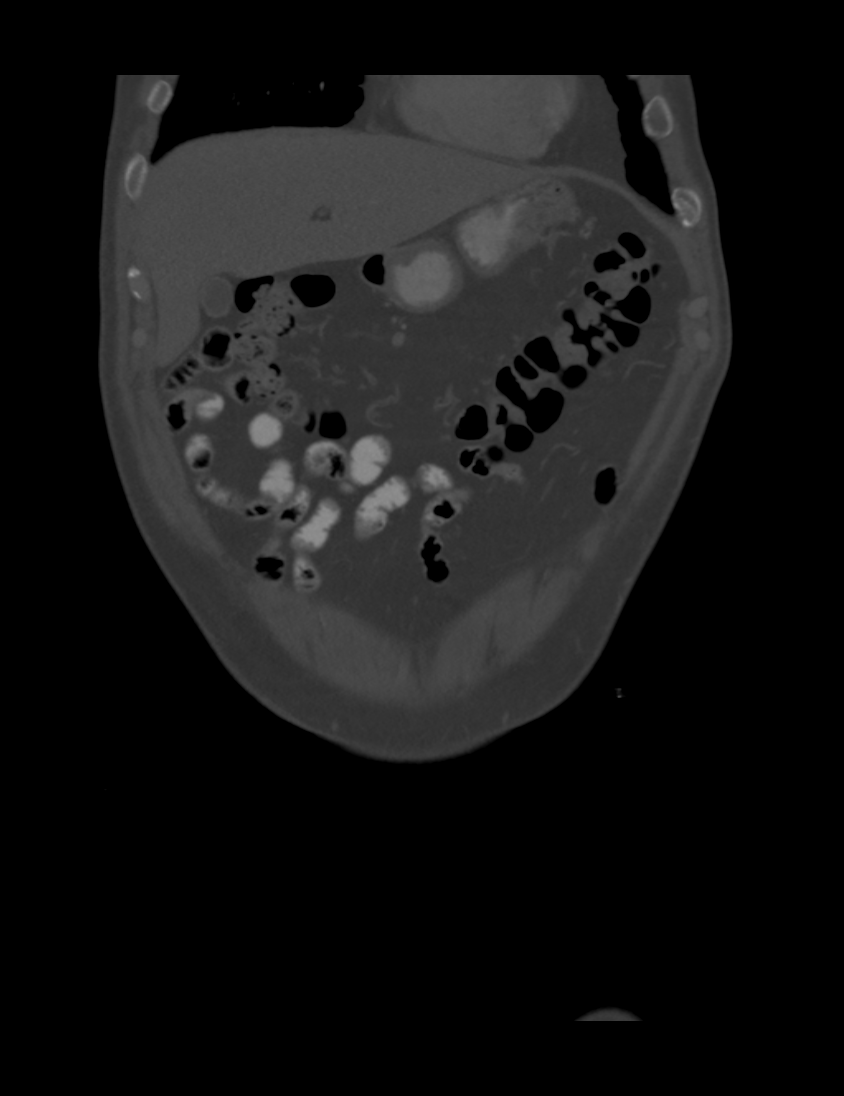
[im 74/133  soft-tissue]
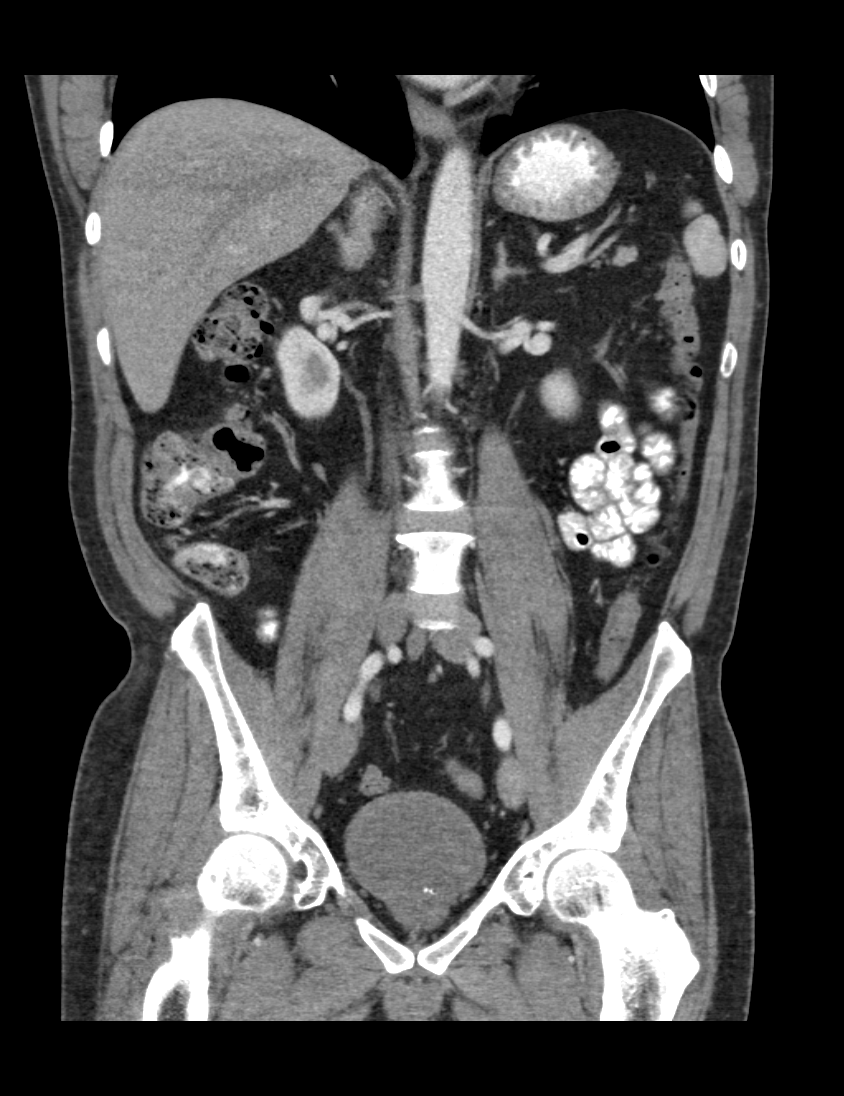
[im 103/133  soft-tissue]
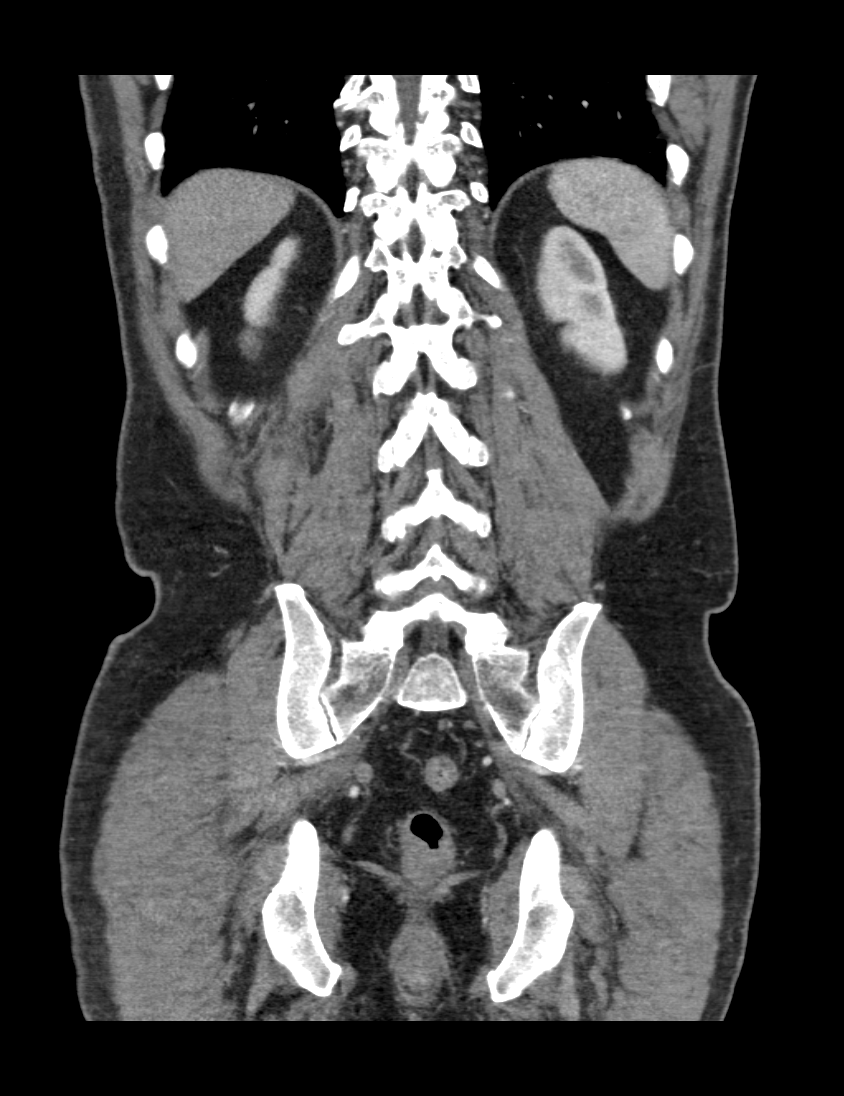

[Series 205: sag · sagittal · 0.50mm/px · 1 of 167 slices shown]
[im 56/167  soft-tissue]
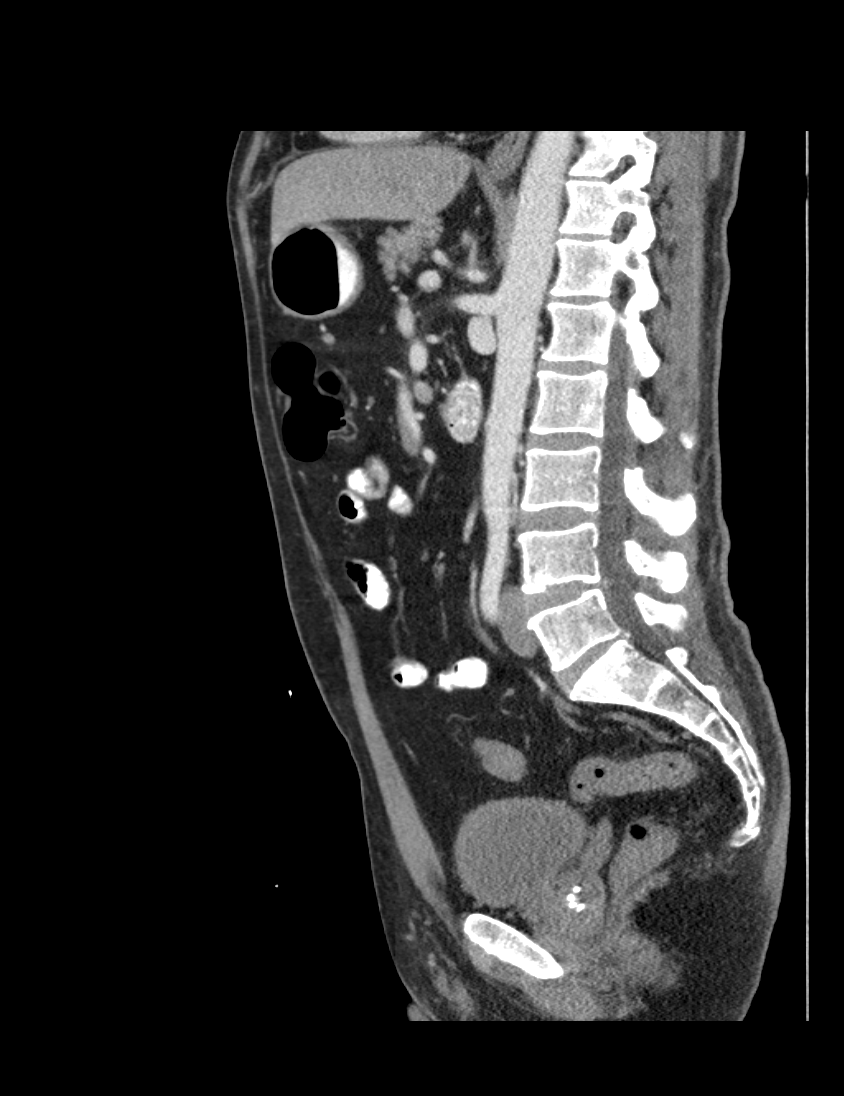

[4 of 46 positions shown; findings below may reference images not displayed]

FINDINGS: The lung bases are free of acute infiltrate or sizable effusion. The
liver, gallbladder, spleen, adrenal glands and pancreas are within
normal limits. The kidneys are well visualized bilaterally. No renal
calculi or urinary tract obstructive changes are noted. Normal
excretion of contrast is noted on delayed images. The ureters in
urinary bladder are unremarkable.

Prostatic calcifications are seen. The appendix is well visualized
and within normal limits. No inflammatory changes are seen. Very
minimal diverticular change of the colon is noted although no
diverticulitis is seen. No pelvic mass lesion or sidewall
abnormality is noted. No acute bony abnormality is noted.
IMPRESSION: No acute abnormality is noted. Specifically no appendicitis is seen.

## 2016-07-07 ENCOUNTER — Encounter (HOSPITAL_COMMUNITY): Payer: Self-pay

## 2016-07-07 ENCOUNTER — Observation Stay (HOSPITAL_COMMUNITY)
Admission: EM | Admit: 2016-07-07 | Discharge: 2016-07-09 | DRG: 872 | Disposition: A | Discharge status: Home / Self Care | Payer: BLUE CROSS/BLUE SHIELD | Attending: Internal Medicine | Admitting: Internal Medicine

## 2016-07-07 ENCOUNTER — Emergency Department (HOSPITAL_COMMUNITY): Payer: BLUE CROSS/BLUE SHIELD

## 2016-07-07 DIAGNOSIS — I152 Hypertension secondary to endocrine disorders: Secondary | ICD-10-CM | POA: Diagnosis present

## 2016-07-07 DIAGNOSIS — E119 Type 2 diabetes mellitus without complications: Secondary | ICD-10-CM | POA: Diagnosis not present

## 2016-07-07 DIAGNOSIS — E1159 Type 2 diabetes mellitus with other circulatory complications: Secondary | ICD-10-CM | POA: Diagnosis present

## 2016-07-07 DIAGNOSIS — I10 Essential (primary) hypertension: Secondary | ICD-10-CM | POA: Diagnosis not present

## 2016-07-07 DIAGNOSIS — N10 Acute tubulo-interstitial nephritis: Secondary | ICD-10-CM | POA: Diagnosis not present

## 2016-07-07 DIAGNOSIS — N12 Tubulo-interstitial nephritis, not specified as acute or chronic: Secondary | ICD-10-CM | POA: Diagnosis present

## 2016-07-07 DIAGNOSIS — Z87440 Personal history of urinary (tract) infections: Secondary | ICD-10-CM | POA: Diagnosis not present

## 2016-07-07 DIAGNOSIS — I2510 Atherosclerotic heart disease of native coronary artery without angina pectoris: Secondary | ICD-10-CM | POA: Diagnosis not present

## 2016-07-07 DIAGNOSIS — I7300 Raynaud's syndrome without gangrene: Secondary | ICD-10-CM | POA: Diagnosis present

## 2016-07-07 DIAGNOSIS — R109 Unspecified abdominal pain: Secondary | ICD-10-CM

## 2016-07-07 DIAGNOSIS — Z7984 Long term (current) use of oral hypoglycemic drugs: Secondary | ICD-10-CM | POA: Diagnosis not present

## 2016-07-07 DIAGNOSIS — K219 Gastro-esophageal reflux disease without esophagitis: Secondary | ICD-10-CM | POA: Diagnosis not present

## 2016-07-07 DIAGNOSIS — A419 Sepsis, unspecified organism: Secondary | ICD-10-CM | POA: Diagnosis not present

## 2016-07-07 LAB — CBC WITH DIFFERENTIAL/PLATELET
BASOS ABS: 0.1 10*3/uL (ref 0.0–0.1)
BASOS PCT: 0 %
EOS PCT: 0 %
Eosinophils Absolute: 0.1 10*3/uL (ref 0.0–0.7)
HCT: 51.6 % (ref 39.0–52.0)
Hemoglobin: 18.7 g/dL — ABNORMAL HIGH (ref 13.0–17.0)
LYMPHS ABS: 1.1 10*3/uL (ref 0.7–4.0)
Lymphocytes Relative: 4 %
MCH: 32.6 pg (ref 26.0–34.0)
MCHC: 36.2 g/dL — ABNORMAL HIGH (ref 30.0–36.0)
MCV: 89.9 fL (ref 78.0–100.0)
Monocytes Absolute: 2 10*3/uL — ABNORMAL HIGH (ref 0.1–1.0)
Monocytes Relative: 8 %
Neutro Abs: 23 10*3/uL — ABNORMAL HIGH (ref 1.7–7.7)
Neutrophils Relative %: 88 %
Platelets: 224 10*3/uL (ref 150–400)
RBC: 5.74 MIL/uL (ref 4.22–5.81)
RDW: 12.3 % (ref 11.5–15.5)
WBC: 26.2 10*3/uL — ABNORMAL HIGH (ref 4.0–10.5)

## 2016-07-07 LAB — URINALYSIS, ROUTINE W REFLEX MICROSCOPIC
Bilirubin Urine: NEGATIVE
Glucose, UA: >1000 mg/dL — AB
Ketones, ur: NEGATIVE mg/dL
NITRITE: NEGATIVE
PH: 6.5 (ref 5.0–8.0)
Protein, ur: NEGATIVE mg/dL
SPECIFIC GRAVITY, URINE: 1.015 (ref 1.005–1.030)

## 2016-07-07 LAB — COMPREHENSIVE METABOLIC PANEL
ALBUMIN: 4.8 g/dL (ref 3.5–5.0)
ALT: 32 U/L (ref 17–63)
AST: 20 U/L (ref 15–41)
Alkaline Phosphatase: 56 U/L (ref 38–126)
Anion gap: 11 (ref 5–15)
BUN: 16 mg/dL (ref 6–20)
CO2: 21 mmol/L — ABNORMAL LOW (ref 22–32)
Calcium: 9.5 mg/dL (ref 8.9–10.3)
Chloride: 100 mmol/L — ABNORMAL LOW (ref 101–111)
Creatinine, Ser: 0.87 mg/dL (ref 0.61–1.24)
GFR calc Af Amer: >60 mL/min (ref >60)
GFR calc non Af Amer: >60 mL/min (ref >60)
GLUCOSE: 137 mg/dL — AB (ref 65–99)
POTASSIUM: 3.5 mmol/L (ref 3.5–5.1)
SODIUM: 132 mmol/L — AB (ref 135–145)
Total Bilirubin: 1.1 mg/dL (ref 0.3–1.2)
Total Protein: 7.9 g/dL (ref 6.5–8.1)

## 2016-07-07 LAB — URINE MICROSCOPIC-ADD ON

## 2016-07-07 MED ORDER — MORPHINE SULFATE (PF) 4 MG/ML IV SOLN
4.0000 mg | Freq: Once | INTRAVENOUS | Status: AC
  Administered 2016-07-07: 4 mg via INTRAVENOUS
  Filled 2016-07-07: qty 1

## 2016-07-07 MED ORDER — ONDANSETRON HCL 4 MG/2ML IJ SOLN
4.0000 mg | Freq: Once | INTRAMUSCULAR | Status: AC
  Administered 2016-07-07: 4 mg via INTRAVENOUS
  Filled 2016-07-07: qty 2

## 2016-07-07 MED ORDER — DEXTROSE 5 % IV SOLN
1.0000 g | Freq: Once | INTRAVENOUS | Status: AC
  Administered 2016-07-07: 1 g via INTRAVENOUS
  Filled 2016-07-07: qty 10

## 2016-07-07 NOTE — ED Provider Notes (Signed)
[facility]-EMER[facility]ENCY DEPT Provider Note   CSN: [id number] Arrival date & time: 07/07/16  4098  First Provider Contact:  First MD Initiated Contact with Patient 07/07/16 2221    By signing my name below, I, Emmanuella Mensah, attest that this documentation has been prepared under the direction and in the presence of Burgess Amor, PA-C. Electronically Signed: Angelene Giovanni, ED Scribe. 07/07/16. 10:49 PM.   History   Chief Complaint Chief Complaint  Patient presents with  . Flank Pain    HPI Comments: Zachary Estrada is a 55 y.o. male with a hx of hypertension, DM, and CAD who presents to the Emergency Department complaining of gradually worsening constant moderate left flank pain onset 12 pm today. He notes his pain is if "someone is stabbing him with a knife". He reports associated low grade fever, chills, nausea, pain with urination, urinary frequency, and dark urine. He adds that he also has RLQ and has not been able to eat appropriately since onset but has been able to drink a small amount of water here in the ED. No alleviating factors noted. Pt has not tried any medications PTA. He reports a hx of kidney infection but denies a hx of kidney stones. He reports that his CBG PTA was 155. He states that he has chronic L5 back pain and these symptoms are more consistent with his kidney infection than the back pain. No v/d or hematuria although his urine was darker than normal today.   PCP: Dr. Kevan Ny  The history is provided by the patient. No language interpreter was used.    Past Medical History:  Diagnosis Date  . Coronary artery disease   . Diabetes mellitus without complication (HCC)   . GERD (gastroesophageal reflux disease)   . Hypertension   . Pneumothorax, acute   . Raynaud disease     Patient Active Problem List   Diagnosis Date Noted  . Acute pyelonephritis 07/08/2016  . Coronary artery disease   . Diabetes mellitus without complication (HCC)   . Hypertension   . Raynaud  disease     Past Surgical History:  Procedure Laterality Date  . BACK SURGERY         Home Medications    Prior to Admission medications   Medication Sig Start Date End Date Taking? Authorizing Provider  atorvastatin (LIPITOR) 20 MG tablet Take 10 mg by mouth daily.    Historical Provider, MD  fish oil-omega-3 fatty acids 1000 MG capsule Take 1 g by mouth daily.    Historical Provider, MD  INVOKAMET 50-1000 MG TABS Take 1 tablet by mouth 2 (two) times daily. 04/10/15   Historical Provider, MD  metFORMIN (GLUCOPHAGE) 500 MG tablet Take 1 tablet (500 mg total) by mouth 2 (two) times daily with a meal. 01/01/15   Nelva Nay, MD  metroNIDAZOLE (FLAGYL) 500 MG tablet Take 1 tablet (500 mg total) by mouth 2 (two) times daily. 04/21/15   Gerhard Munch, MD  Multiple Vitamins-Minerals (ONE-A-DAY MENS 50+ ADVANTAGE PO) Take 1 tablet by mouth daily.    Historical Provider, MD  pantoprazole (PROTONIX) 40 MG tablet Take 40 mg by mouth daily. 07/07/12   Peyton Najjar, MD  potassium chloride SA (K-DUR,KLOR-CON) 20 MEQ tablet Take 1 tablet (20 mEq total) by mouth daily. Patient not taking: Reported on 04/21/2015 01/01/15   Nelva Nay, MD  triamterene-hydrochlorothiazide (MAXZIDE-25) 37.5-25 MG per tablet Take 0.5 tablets by mouth daily.    Historical Provider, MD    Family History History reviewed.  No pertinent family history.  Social History Social History  Substance Use Topics  . Smoking status: Never Smoker  . Smokeless tobacco: Never Used  . Alcohol use No     Allergies   Review of patient's allergies indicates no known allergies.   Review of Systems Review of Systems  Constitutional: Positive for chills and fever.  Gastrointestinal: Negative for abdominal pain, diarrhea, nausea and vomiting.  Genitourinary: Positive for dysuria. Negative for hematuria.  All other systems reviewed and are negative.    Physical Exam Updated Vital Signs   Physical Exam  Constitutional: He  is oriented to person, place, and time. He appears well-developed and well-nourished.  HENT:  Head: Normocephalic and atraumatic.  Cardiovascular: Normal rate.   Pulmonary/Chest: Effort normal.  Abdominal: There is tenderness. There is no rebound and no guarding.  Left flank tenderness RLQ tenderness No suprapubic tenderness  Neurological: He is alert and oriented to person, place, and time.  Skin: Skin is warm and dry.  Psychiatric: He has a normal mood and affect.  Nursing note and vitals reviewed.    ED Treatments / Results  DIAGNOSTIC STUDIES: Oxygen Saturation is 100% on RA, normal by my interpretation.    COORDINATION OF CARE: 10:32 PM- Pt advised of plan for treatment and pt agrees. Pt informed of his lab results. He will receive Rocephin IV, morphine, and Zofran here. He will also receive a CT renal stone study for further evaluation.   Labs   Results for orders placed or performed during the hospital encounter of 07/07/16  Blood culture (routine x 2)  Result Value Ref Range   Specimen Description LEFT ANTECUBITAL    Special Requests BOTTLES DRAWN AEROBIC AND ANAEROBIC 6CC    Culture PENDING    Report Status PENDING   Blood culture (routine x 2)  Result Value Ref Range   Specimen Description BLOOD LEFT ARM    Special Requests BOTTLES DRAWN AEROBIC AND ANAEROBIC 6CC    Culture PENDING    Report Status PENDING   Urinalysis, Routine w reflex microscopic (not at [facility])  Result Value Ref Range   Color, Urine YELLOW YELLOW   APPearance HAZY (A) CLEAR   Specific Gravity, Urine 1.015 1.005 - 1.030   pH 6.5 5.0 - 8.0   Glucose, UA >1000 (A) NEGATIVE mg/dL   Hgb urine dipstick MODERATE (A) NEGATIVE   Bilirubin Urine NEGATIVE NEGATIVE   Ketones, ur NEGATIVE NEGATIVE mg/dL   Protein, ur NEGATIVE NEGATIVE mg/dL   Nitrite NEGATIVE NEGATIVE   Leukocytes, UA MODERATE (A) NEGATIVE  Urine microscopic-add on  Result Value Ref Range   Squamous Epithelial / LPF 0-5 (A) NONE  SEEN   WBC, UA 6-30 0 - 5 WBC/hpf   RBC / HPF 6-30 0 - 5 RBC/hpf   Bacteria, UA RARE (A) NONE SEEN  Comprehensive metabolic panel  Result Value Ref Range   Sodium 132 (L) 135 - 145 mmol/L   Potassium 3.5 3.5 - 5.1 mmol/L   Chloride 100 (L) 101 - 111 mmol/L   CO2 21 (L) 22 - 32 mmol/L   Glucose, Bld 137 (H) 65 - 99 mg/dL   BUN 16 6 - 20 mg/dL   Creatinine, Ser 4.09 0.61 - 1.24 mg/dL   Calcium 9.5 8.9 - 81.1 mg/dL   Total Protein 7.9 6.5 - 8.1 g/dL   Albumin 4.8 3.5 - 5.0 g/dL   AST 20 15 - 41 U/L   ALT 32 17 - 63 U/L   Alkaline Phosphatase  56 38 - 126 U/L   Total Bilirubin 1.1 0.3 - 1.2 mg/dL   GFR calc non Af Amer >60 >60 mL/min   GFR calc Af Amer >60 >60 mL/min   Anion gap 11 5 - 15  CBC with Differential  Result Value Ref Range   WBC 26.2 (H) 4.0 - 10.5 K/uL   RBC 5.74 4.22 - 5.81 MIL/uL   Hemoglobin 18.7 (H) 13.0 - 17.0 g/dL   HCT 16.1 09.6 - 04.5 %   MCV 89.9 78.0 - 100.0 fL   MCH 32.6 26.0 - 34.0 pg   MCHC 36.2 (H) 30.0 - 36.0 g/dL   RDW 40.9 81.1 - 91.4 %   Platelets 224 150 - 400 K/uL   Neutrophils Relative % 88 %   Neutro Abs 23.0 (H) 1.7 - 7.7 K/uL   Lymphocytes Relative 4 %   Lymphs Abs 1.1 0.7 - 4.0 K/uL   Monocytes Relative 8 %   Monocytes Absolute 2.0 (H) 0.1 - 1.0 K/uL   Eosinophils Relative 0 %   Eosinophils Absolute 0.1 0.0 - 0.7 K/uL   Basophils Relative 0 %   Basophils Absolute 0.1 0.0 - 0.1 K/uL  I-Stat CG4 Lactic Acid, ED  Result Value Ref Range   Lactic Acid, Venous 1.32 0.5 - 1.9 mmol/L   Ct Renal Stone Study  Result Date: 07/07/2016 CLINICAL DATA:  Progressive left flank pain over the past 10 hours. EXAM: CT ABDOMEN AND PELVIS WITHOUT CONTRAST TECHNIQUE: Multidetector CT imaging of the abdomen and pelvis was performed following the standard protocol without IV contrast. COMPARISON:  CT 04/21/2015 FINDINGS: Lower chest: Subsegmental linear atelectasis in both lower lobes. Minimal fissural thickening the minor fissure is likely secondary to  intrapulmonary lymph node. Liver: Borderline hepatic steatosis.  No evidence of focal lesion. Hepatobiliary: Gallbladder physiologically distended, no calcified stone. No biliary dilatation. Pancreas: No ductal dilatation or inflammation. Mild parenchymal atrophy. Spleen: Normal. Adrenal glands: No nodule. Kidneys: Symmetric in size without stones or hydronephrosis. There is no perinephric stranding. Both ureters are decompressed without stones along the course. Stomach/Bowel: Stomach is decompressed. There are no dilated or thickened small bowel loops. Small volume of stool throughout the colon without colonic wall thickening. Minimal diverticulosis of the descending and sigmoid colon without acute diverticulitis. The appendix is normal. Vascular/Lymphatic: No retroperitoneal, mesenteric or pelvic adenopathy. Abdominal aorta is normal in caliber. Reproductive: Central prostatic calcifications. Prostate gland appears prominent size measuring 5.2 x 4.4 cm. Bladder: Physiologically distended without wall thickening. Other: No free air, free fluid, or intra-abdominal fluid collection. Tiny fat containing umbilical hernia. Musculoskeletal: There are no acute or suspicious osseous abnormalities. IMPRESSION: 1.  No renal stones or obstructive uropathy. 2. Minimal diverticulosis in the descending and sigmoid colon without acute diverticulitis. 3. Mildly enlarged prostate gland. Electronically Signed   By: Rubye Oaks M.D.   On: 07/07/2016 23:49    EKG  EKG Interpretation None       Radiology Ct Renal Stone Study  Result Date: 07/07/2016 CLINICAL DATA:  Progressive left flank pain over the past 10 hours. EXAM: CT ABDOMEN AND PELVIS WITHOUT CONTRAST TECHNIQUE: Multidetector CT imaging of the abdomen and pelvis was performed following the standard protocol without IV contrast. COMPARISON:  CT 04/21/2015 FINDINGS: Lower chest: Subsegmental linear atelectasis in both lower lobes. Minimal fissural thickening the  minor fissure is likely secondary to intrapulmonary lymph node. Liver: Borderline hepatic steatosis.  No evidence of focal lesion. Hepatobiliary: Gallbladder physiologically distended, no calcified stone. No biliary dilatation.  Pancreas: No ductal dilatation or inflammation. Mild parenchymal atrophy. Spleen: Normal. Adrenal glands: No nodule. Kidneys: Symmetric in size without stones or hydronephrosis. There is no perinephric stranding. Both ureters are decompressed without stones along the course. Stomach/Bowel: Stomach is decompressed. There are no dilated or thickened small bowel loops. Small volume of stool throughout the colon without colonic wall thickening. Minimal diverticulosis of the descending and sigmoid colon without acute diverticulitis. The appendix is normal. Vascular/Lymphatic: No retroperitoneal, mesenteric or pelvic adenopathy. Abdominal aorta is normal in caliber. Reproductive: Central prostatic calcifications. Prostate gland appears prominent size measuring 5.2 x 4.4 cm. Bladder: Physiologically distended without wall thickening. Other: No free air, free fluid, or intra-abdominal fluid collection. Tiny fat containing umbilical hernia. Musculoskeletal: There are no acute or suspicious osseous abnormalities. IMPRESSION: 1.  No renal stones or obstructive uropathy. 2. Minimal diverticulosis in the descending and sigmoid colon without acute diverticulitis. 3. Mildly enlarged prostate gland. Electronically Signed   By: Rubye OaksMelanie  Ehinger M.D.   On: 07/07/2016 23:49    Procedures Procedures (including critical care time)  Medications Ordered in ED Medications  morphine 4 MG/ML injection 4 mg (4 mg Intravenous Given 07/07/16 2238)  ondansetron (ZOFRAN) injection 4 mg (4 mg Intravenous Given 07/07/16 2238)  cefTRIAXone (ROCEPHIN) 1 g in dextrose 5 % 50 mL IVPB (0 g Intravenous Stopped 07/08/16 0025)  sodium chloride 0.9 % bolus 1,000 mL (1,000 mLs Intravenous New Bag/Given 07/08/16 0152)  ketorolac  (TORADOL) 30 MG/ML injection 15 mg (15 mg Intravenous Given 07/08/16 [id number])     Initial Impression / Assessment and Plan / ED Course  Burgess AmorJulie Allard Lightsey, PA-C has reviewed the triage vital signs and the nursing notes.   Clinical Course    Pt with PMH of  DM, fevers, dysuria and left flank pain and significant leukocytosis,  history and exam suggesting pyelonephritis,  Labs reviewed wbc count of 26.2 but with normal lactate, CT imaging negative for stones and abscess.  Pyelonephritis without sepsis.  Pt given IV rocephin here. Urine cx sent.  Discussed with Dr. Onalee Huaavid who will admit for overnight observation.   Final Clinical Impressions(s) / ED Diagnoses   Final diagnoses:  Flank pain  Pyelonephritis   I personally performed the services described in this documentation, which was scribed in my presence. The recorded information has been reviewed and is accurate.  New Prescriptions New Prescriptions   No medications on file     Burgess AmorJulie Hai Grabe, PA-C 07/08/16 [id number]    Devoria AlbeIva Knapp, MD 07/08/16 [id number]

## 2016-07-07 NOTE — ED Triage Notes (Signed)
Left flank pain started today c/o painful urination.

## 2016-07-08 ENCOUNTER — Encounter (HOSPITAL_COMMUNITY): Payer: Self-pay | Admitting: Family Medicine

## 2016-07-08 DIAGNOSIS — I152 Hypertension secondary to endocrine disorders: Secondary | ICD-10-CM | POA: Diagnosis present

## 2016-07-08 DIAGNOSIS — N10 Acute tubulo-interstitial nephritis: Secondary | ICD-10-CM | POA: Diagnosis present

## 2016-07-08 DIAGNOSIS — I2510 Atherosclerotic heart disease of native coronary artery without angina pectoris: Secondary | ICD-10-CM | POA: Diagnosis present

## 2016-07-08 DIAGNOSIS — N12 Tubulo-interstitial nephritis, not specified as acute or chronic: Secondary | ICD-10-CM | POA: Diagnosis present

## 2016-07-08 DIAGNOSIS — I10 Essential (primary) hypertension: Secondary | ICD-10-CM | POA: Diagnosis present

## 2016-07-08 DIAGNOSIS — E119 Type 2 diabetes mellitus without complications: Secondary | ICD-10-CM | POA: Diagnosis not present

## 2016-07-08 DIAGNOSIS — E1159 Type 2 diabetes mellitus with other circulatory complications: Secondary | ICD-10-CM | POA: Diagnosis present

## 2016-07-08 DIAGNOSIS — I7300 Raynaud's syndrome without gangrene: Secondary | ICD-10-CM | POA: Diagnosis present

## 2016-07-08 LAB — BASIC METABOLIC PANEL
Anion gap: 9 (ref 5–15)
BUN: 18 mg/dL (ref 6–20)
CO2: 22 mmol/L (ref 22–32)
Calcium: 8.3 mg/dL — ABNORMAL LOW (ref 8.9–10.3)
Chloride: 105 mmol/L (ref 101–111)
Creatinine, Ser: 0.97 mg/dL (ref 0.61–1.24)
GFR calc Af Amer: >60 mL/min (ref >60)
GFR calc non Af Amer: >60 mL/min (ref >60)
Glucose, Bld: 153 mg/dL — ABNORMAL HIGH (ref 65–99)
Potassium: 3.7 mmol/L (ref 3.5–5.1)
Sodium: 136 mmol/L (ref 135–145)

## 2016-07-08 LAB — CBC
HCT: 46.2 % (ref 39.0–52.0)
Hemoglobin: 16.5 g/dL (ref 13.0–17.0)
MCH: 32.4 pg (ref 26.0–34.0)
MCHC: 35.7 g/dL (ref 30.0–36.0)
MCV: 90.6 fL (ref 78.0–100.0)
Platelets: 204 10*3/uL (ref 150–400)
RBC: 5.1 MIL/uL (ref 4.22–5.81)
RDW: 12.4 % (ref 11.5–15.5)
WBC: 24.8 10*3/uL — ABNORMAL HIGH (ref 4.0–10.5)

## 2016-07-08 LAB — GLUCOSE, CAPILLARY
GLUCOSE-CAPILLARY: 168 mg/dL — AB (ref 65–99)
GLUCOSE-CAPILLARY: 148 mg/dL — AB (ref 65–99)
GLUCOSE-CAPILLARY: 193 mg/dL — AB (ref 65–99)

## 2016-07-08 LAB — I-STAT CG4 LACTIC ACID, ED: Lactic Acid, Venous: 1.32 mmol/L (ref 0.5–1.9)

## 2016-07-08 MED ORDER — ACETAMINOPHEN 325 MG PO TABS
650.0000 mg | ORAL_TABLET | Freq: Four times a day (QID) | ORAL | Status: DC | PRN
  Administered 2016-07-08: 650 mg via ORAL
  Filled 2016-07-08: qty 2

## 2016-07-08 MED ORDER — KETOROLAC TROMETHAMINE 30 MG/ML IJ SOLN
15.0000 mg | Freq: Once | INTRAMUSCULAR | Status: AC
  Administered 2016-07-08: 15 mg via INTRAVENOUS
  Filled 2016-07-08: qty 1

## 2016-07-08 MED ORDER — INSULIN ASPART 100 UNIT/ML ~~LOC~~ SOLN
0.0000 [IU] | Freq: Three times a day (TID) | SUBCUTANEOUS | Status: DC
  Administered 2016-07-08 – 2016-07-09 (×2): 2 [IU] via SUBCUTANEOUS

## 2016-07-08 MED ORDER — SODIUM CHLORIDE 0.9 % IV SOLN: INTRAVENOUS | Status: AC

## 2016-07-08 MED ORDER — ONDANSETRON HCL 4 MG/2ML IJ SOLN: 4.0000 mg | Freq: Three times a day (TID) | INTRAMUSCULAR | Status: AC | PRN

## 2016-07-08 MED ORDER — POTASSIUM CHLORIDE CRYS ER 20 MEQ PO TBCR
40.0000 meq | EXTENDED_RELEASE_TABLET | Freq: Four times a day (QID) | ORAL | Status: AC
  Administered 2016-07-08 (×2): 40 meq via ORAL
  Filled 2016-07-08 (×2): qty 2

## 2016-07-08 MED ORDER — SODIUM CHLORIDE 0.9 % IV BOLUS (SEPSIS)
1000.0000 mL | Freq: Once | INTRAVENOUS | Status: AC
  Administered 2016-07-08: 1000 mL via INTRAVENOUS

## 2016-07-08 MED ORDER — ACETAMINOPHEN 650 MG RE SUPP: 650.0000 mg | Freq: Four times a day (QID) | RECTAL | Status: DC | PRN

## 2016-07-08 MED ORDER — DEXTROSE 5 % IV SOLN
1.0000 g | INTRAVENOUS | Status: DC
  Administered 2016-07-08: 1 g via INTRAVENOUS
  Filled 2016-07-08 (×2): qty 10

## 2016-07-08 MED ORDER — SODIUM CHLORIDE 0.9 % IV SOLN
INTRAVENOUS | Status: DC
  Administered 2016-07-08 (×2): via INTRAVENOUS

## 2016-07-08 NOTE — H&P (Signed)
History and Physical    Zachary Estrada:[id number] DOB: 04-07-1961 DOA: 07/07/2016  PCP: Pearla Dubonnet, MD  Patient coming from: home  Chief Complaint:   Painful urination, back pain, chills  HPI: Zachary Estrada is a 56 y.o. male with medical history significant of DM, CAD, HTN comes in after waking up this am and noticed pain when he tried to urinate, not really burning but it was painful.  Through the day he began to feel worse having pain in his left flank with chills and subjective fever.  He has had a kidney infection before but 29 years ago, he has not been on antibiotics recently.  Pt found to have acute pyelonephritis.  He has not been vomiting.     Review of Systems: As per HPI otherwise 10 point review of systems negative.   Past Medical History:  Diagnosis Date  . Coronary artery disease   . Diabetes mellitus without complication (HCC)   . GERD (gastroesophageal reflux disease)   . Hypertension   . Pneumothorax, acute   . Raynaud disease     Past Surgical History:  Procedure Laterality Date  . BACK SURGERY       reports that he has never smoked. He has never used smokeless tobacco. He reports that he does not drink alcohol. His drug history is not on file.  No Known Allergies  History reviewed. No pertinent family history.  Prior to Admission medications   Medication Sig Start Date End Date Taking? Authorizing Provider  atorvastatin (LIPITOR) 20 MG tablet Take 10 mg by mouth daily.    Historical Provider, MD  ciprofloxacin (CIPRO) 500 MG tablet Take 1 tablet (500 mg total) by mouth 2 (two) times daily. 04/21/15   Gerhard Munch, MD  fish oil-omega-3 fatty acids 1000 MG capsule Take 1 g by mouth daily.    Historical Provider, MD  INVOKAMET 50-1000 MG TABS Take 1 tablet by mouth 2 (two) times daily. 04/10/15   Historical Provider, MD  metFORMIN (GLUCOPHAGE) 500 MG tablet Take 1 tablet (500 mg total) by mouth 2 (two) times daily with a meal. 01/01/15   Nelva Nay, MD  metroNIDAZOLE (FLAGYL) 500 MG tablet Take 1 tablet (500 mg total) by mouth 2 (two) times daily. 04/21/15   Gerhard Munch, MD  Multiple Vitamins-Minerals (ONE-A-DAY MENS 50+ ADVANTAGE PO) Take 1 tablet by mouth daily.    Historical Provider, MD  pantoprazole (PROTONIX) 40 MG tablet Take 40 mg by mouth daily. 07/07/12   Peyton Najjar, MD  potassium chloride SA (K-DUR,KLOR-CON) 20 MEQ tablet Take 1 tablet (20 mEq total) by mouth daily. Patient not taking: Reported on 04/21/2015 01/01/15   Nelva Nay, MD  triamterene-hydrochlorothiazide (MAXZIDE-25) 37.5-25 MG per tablet Take 0.5 tablets by mouth daily.    Historical Provider, MD    Physical Exam: Vitals:   07/07/16 1936 07/07/16 2335 07/08/16 0153  BP: 134/94 116/88 118/75  Pulse: 118 116 105  Resp: Temp: 100 F (37.8 C)  99 F (37.2 C)  TempSrc: Oral  Oral  SpO2: 100% 93% 95%  Weight: 83.9 kg (185 lb)    Height:  (1.727 m)        Constitutional: NAD, calm, comfortable Vitals:   07/07/16 1936 07/07/16 2335 07/08/16 0153  BP: 134/94 116/88 118/75  Pulse: 118 116 105  Resp: Temp: 100 F (37.8 C)  99 F (37.2 C)  TempSrc: Oral  Oral  SpO2: 100%  93% 95%  Weight: 83.9 kg (185 lb)    Height: 5\' 8"  (1.727 m)     Eyes: PERRL, lids and conjunctivae normal ENMT: Mucous membranes are moist. Posterior pharynx clear of any exudate or lesions.Normal dentition.  Neck: normal, supple, no masses, no thyromegaly Respiratory: clear to auscultation bilaterally, no wheezing, no crackles. Normal respiratory effort. No accessory muscle use.  Cardiovascular: Regular rate and rhythm, no murmurs / rubs / gallops. No extremity edema. 2+ pedal pulses. No carotid bruits.  Abdomen: no tenderness, no masses palpated. No hepatosplenomegaly. Bowel sounds positive.  Left flank pain/CVA ttp Musculoskeletal: no clubbing / cyanosis. No joint deformity upper and lower extremities. Good ROM, no contractures. Normal muscle  tone.  Skin: no rashes, lesions, ulcers. No induration Neurologic: CN 2-12 grossly intact. Sensation intact, DTR normal. Strength 5/5 in all 4.  Psychiatric: Normal judgment and insight. Alert and oriented x 3. Normal mood.    Labs on Admission: I have personally reviewed following labs and imaging studies  CBC:  Recent Labs Lab 07/07/16 2230  WBC 26.2*  NEUTROABS 23.0*  HGB 18.7*  HCT 51.6  MCV 89.9  PLT 224   Basic Metabolic Panel:  Recent Labs Lab 07/07/16 2230  NA 132*  K 3.5  CL 100*  CO2 21*  GLUCOSE 137*  BUN 16  CREATININE 0.87  CALCIUM 9.5   GFR: Estimated Creatinine Clearance: 104.8 mL/min (by C-G formula based on SCr of 0.87 mg/dL). Liver Function Tests:  Recent Labs Lab 07/07/16 2230  AST 20  ALT 32  ALKPHOS 56  BILITOT 1.1  PROT 7.9  ALBUMIN 4.8   Urine analysis:    Component Value Date/Time   COLORURINE YELLOW 07/07/2016 2040   APPEARANCEUR HAZY (A) 07/07/2016 2040   LABSPEC 1.015 07/07/2016 2040   PHURINE 6.5 07/07/2016 2040   GLUCOSEU >1000 (A) 07/07/2016 2040   HGBUR MODERATE (A) 07/07/2016 2040   BILIRUBINUR NEGATIVE 07/07/2016 2040   KETONESUR NEGATIVE 07/07/2016 2040   PROTEINUR NEGATIVE 07/07/2016 2040   UROBILINOGEN 0.2 04/21/2015 1400   NITRITE NEGATIVE 07/07/2016 2040   LEUKOCYTESUR MODERATE (A) 07/07/2016 2040    Recent Results (from the past 240 hour(s))  Blood culture (routine x 2)     Status: None (Preliminary result)   Collection Time: 07/08/16  1:52 AM  Result Value Ref Range Status   Specimen Description LEFT ANTECUBITAL  Final   Special Requests BOTTLES DRAWN AEROBIC AND ANAEROBIC 6CC  Final   Culture PENDING  Incomplete   Report Status PENDING  Incomplete  Blood culture (routine x 2)     Status: None (Preliminary result)   Collection Time: 07/08/16  2:04 AM  Result Value Ref Range Status   Specimen Description BLOOD LEFT ARM  Final   Special Requests BOTTLES DRAWN AEROBIC AND ANAEROBIC 6CC  Final   Culture  PENDING  Incomplete   Report Status PENDING  Incomplete     Radiological Exams on Admission: Ct Renal Stone Study  Result Date: 07/07/2016 CLINICAL DATA:  Progressive left flank pain over the past 10 hours. EXAM: CT ABDOMEN AND PELVIS WITHOUT CONTRAST TECHNIQUE: Multidetector CT imaging of the abdomen and pelvis was performed following the standard protocol without IV contrast. COMPARISON:  CT 04/21/2015 FINDINGS: Lower chest: Subsegmental linear atelectasis in both lower lobes. Minimal fissural thickening the minor fissure is likely secondary to intrapulmonary lymph node. Liver: Borderline hepatic steatosis.  No evidence of focal lesion. Hepatobiliary: Gallbladder physiologically distended, no calcified stone. No biliary dilatation. Pancreas: No ductal  dilatation or inflammation. Mild parenchymal atrophy. Spleen: Normal. Adrenal glands: No nodule. Kidneys: Symmetric in size without stones or hydronephrosis. There is no perinephric stranding. Both ureters are decompressed without stones along the course. Stomach/Bowel: Stomach is decompressed. There are no dilated or thickened small bowel loops. Small volume of stool throughout the colon without colonic wall thickening. Minimal diverticulosis of the descending and sigmoid colon without acute diverticulitis. The appendix is normal. Vascular/Lymphatic: No retroperitoneal, mesenteric or pelvic adenopathy. Abdominal aorta is normal in caliber. Reproductive: Central prostatic calcifications. Prostate gland appears prominent size measuring 5.2 x 4.4 cm. Bladder: Physiologically distended without wall thickening. Other: No free air, free fluid, or intra-abdominal fluid collection. Tiny fat containing umbilical hernia. Musculoskeletal: There are no acute or suspicious osseous abnormalities. IMPRESSION: 1.  No renal stones or obstructive uropathy. 2. Minimal diverticulosis in the descending and sigmoid colon without acute diverticulitis. 3. Mildly enlarged prostate  gland. Electronically Signed   By: Rubye OaksMelanie  Ehinger M.D.   On: 07/07/2016 23:49    Assessment/Plan 55 yo male with acute pyelonephritis  Principal Problem:   Acute pyelonephritis- imaging without evidence of stone.  Place on iv rocephin.  Urine cx pending.  Ivf.  Active Problems:   Coronary artery disease- stable   Diabetes mellitus without complication (HCC)- place on SSI   Hypertension- stable   Raynaud disease- stable   obs on medical bed.     DVT prophylaxis: scds  Code Status:   Full code  Raizy Auzenne A MD [facility]  If 7PM-7AM, please contact night-coverage [email or web address] Password [email or web address]  07/08/2016, 3:02 AM

## 2016-07-08 NOTE — Progress Notes (Signed)
PROGRESS NOTE  Zachary EkKenneth N Mosely  ZOX:[id number] DOB: 06/03/1961 DOA: 07/07/2016 PCP: Pearla DubonnetGATES,ROBERT NEVILL, MD Outpatient Specialists:  Subjective: Still complaining about left-sided pain  Brief Narrative:  Zachary Estrada is a 55 y.o. male with medical history significant of DM, CAD, HTN comes in after waking up this am and noticed pain when he tried to urinate, not really burning but it was painful.  Through the day he began to feel worse having pain in his left flank with chills and subjective fever.  He has had a kidney infection before but 29 years ago, he has not been on antibiotics recently.  Pt found to have acute pyelonephritis.  He has not been vomiting.    Assessment & Plan:   Principal Problem:   Acute pyelonephritis Active Problems:   Coronary artery disease involving native coronary artery of native heart without angina pectoris   Diabetes mellitus without complication (HCC)   Essential hypertension   Raynaud disease   This is a no charge note, patient seen earlier today by my colleague Dr. Onalee Huaavid. I seen the patient and examined him, data base reviewed. Acute UTI and possible early acute pyelonephritis, started on Rocephin. This is considered complicated UTI because he is a diabetic male.   DVT prophylaxis: Subcutaneous heparin Code Status: Full Code Family Communication:  Disposition Plan:  Diet: Diet Carb Modified Fluid consistency: Thin; Room service appropriate? Yes  Consultants:   None  Procedures:   None   Antimicrobials:   None   Objective: Vitals:   07/07/16 1936 07/07/16 2335 07/08/16 0153 07/08/16 0635  BP: 134/94 116/88 118/75 121/73  Pulse: 118 116 105 92  Resp: 18 16 16 15   Temp: 100 F (37.8 C)  99 F (37.2 C) 99.3 F (37.4 C)  TempSrc: Oral  Oral Oral  SpO2: 100% 93% 95% 96%  Weight: 83.9 kg (185 lb)   81.3 kg (179 lb 3.2 oz)  Height: 5\' 8"  (1.727 m)   5\' 8"  (1.727 m)   No intake or output data in the 24 hours ending 07/08/16  1253 Filed Weights   07/07/16 1936 07/08/16 0635  Weight: 83.9 kg (185 lb) 81.3 kg (179 lb 3.2 oz)    Examination: General exam: Appears calm and comfortable  Respiratory system: Clear to auscultation. Respiratory effort normal. Cardiovascular system: S1 & S2 heard, RRR. No JVD, murmurs, rubs, gallops or clicks. No pedal edema. Gastrointestinal system: Abdomen is nondistended, soft and nontender. No organomegaly or masses felt. Normal bowel sounds heard. Central nervous system: Alert and oriented. No focal neurological deficits. Extremities: Symmetric 5 x 5 power. Skin: No rashes, lesions or ulcers Psychiatry: Judgement and insight appear normal. Mood & affect appropriate.   Data Reviewed: I have personally reviewed following labs and imaging studies  CBC:  Recent Labs Lab 07/07/16 2230 07/08/16 0652  WBC 26.2* 24.8*  NEUTROABS 23.0*  --   HGB 18.7* 16.5  HCT 51.6 46.2  MCV 89.9 90.6  PLT 224 204   Basic Metabolic Panel:  Recent Labs Lab 07/07/16 2230 07/08/16 0652  NA 132* 136  K 3.5 3.7  CL 100* 105  CO2 21* 22  GLUCOSE 137* 153*  BUN 16 18  CREATININE 0.87 0.97  CALCIUM 9.5 8.3*   GFR: Estimated Creatinine Clearance: 86.2 mL/min (by C-G formula based on SCr of 0.97 mg/dL). Liver Function Tests:  Recent Labs Lab 07/07/16 2230  AST 20  ALT 32  ALKPHOS 56  BILITOT 1.1  PROT 7.9  ALBUMIN 4.8  No results for input(s): LIPASE, AMYLASE in the last 168 hours. No results for input(s): AMMONIA in the last 168 hours. Coagulation Profile: No results for input(s): INR, PROTIME in the last 168 hours. Cardiac Enzymes: No results for input(s): CKTOTAL, CKMB, CKMBINDEX, TROPONINI in the last 168 hours. BNP (last 3 results) No results for input(s): PROBNP in the last 8760 hours. HbA1C: No results for input(s): HGBA1C in the last 72 hours. CBG:  Recent Labs Lab 07/08/16 1156  GLUCAP 168*   Lipid Profile: No results for input(s): CHOL, HDL, LDLCALC, TRIG,  CHOLHDL, LDLDIRECT in the last 72 hours. Thyroid Function Tests: No results for input(s): TSH, T4TOTAL, FREET4, T3FREE, THYROIDAB in the last 72 hours. Anemia Panel: No results for input(s): VITAMINB12, FOLATE, FERRITIN, TIBC, IRON, RETICCTPCT in the last 72 hours. Urine analysis:    Component Value Date/Time   COLORURINE YELLOW 07/07/2016 2040   APPEARANCEUR HAZY (A) 07/07/2016 2040   LABSPEC 1.015 07/07/2016 2040   PHURINE 6.5 07/07/2016 2040   GLUCOSEU >1000 (A) 07/07/2016 2040   HGBUR MODERATE (A) 07/07/2016 2040   BILIRUBINUR NEGATIVE 07/07/2016 2040   KETONESUR NEGATIVE 07/07/2016 2040   PROTEINUR NEGATIVE 07/07/2016 2040   UROBILINOGEN 0.2 04/21/2015 1400   NITRITE NEGATIVE 07/07/2016 2040   LEUKOCYTESUR MODERATE (A) 07/07/2016 2040   Sepsis Labs: @LABRCNTIP (procalcitonin:4,lacticidven:4)  ) Recent Results (from the past 240 hour(s))  Blood culture (routine x 2)     Status: None (Preliminary result)   Collection Time: 07/08/16  1:52 AM  Result Value Ref Range Status   Specimen Description LEFT ANTECUBITAL  Final   Special Requests BOTTLES DRAWN AEROBIC AND ANAEROBIC 6CC  Final   Culture NO GROWTH < 12 HOURS  Final   Report Status PENDING  Incomplete  Blood culture (routine x 2)     Status: None (Preliminary result)   Collection Time: 07/08/16  2:04 AM  Result Value Ref Range Status   Specimen Description BLOOD LEFT ARM  Final   Special Requests BOTTLES DRAWN AEROBIC AND ANAEROBIC 6CC  Final   Culture NO GROWTH < 12 HOURS  Final   Report Status PENDING  Incomplete     Invalid input(s): PROCALCITONIN, LACTICACIDVEN   Radiology Studies: Ct Renal Stone Study  Result Date: 07/07/2016 CLINICAL DATA:  Progressive left flank pain over the past 10 hours. EXAM: CT ABDOMEN AND PELVIS WITHOUT CONTRAST TECHNIQUE: Multidetector CT imaging of the abdomen and pelvis was performed following the standard protocol without IV contrast. COMPARISON:  CT 04/21/2015 FINDINGS: Lower  chest: Subsegmental linear atelectasis in both lower lobes. Minimal fissural thickening the minor fissure is likely secondary to intrapulmonary lymph node. Liver: Borderline hepatic steatosis.  No evidence of focal lesion. Hepatobiliary: Gallbladder physiologically distended, no calcified stone. No biliary dilatation. Pancreas: No ductal dilatation or inflammation. Mild parenchymal atrophy. Spleen: Normal. Adrenal glands: No nodule. Kidneys: Symmetric in size without stones or hydronephrosis. There is no perinephric stranding. Both ureters are decompressed without stones along the course. Stomach/Bowel: Stomach is decompressed. There are no dilated or thickened small bowel loops. Small volume of stool throughout the colon without colonic wall thickening. Minimal diverticulosis of the descending and sigmoid colon without acute diverticulitis. The appendix is normal. Vascular/Lymphatic: No retroperitoneal, mesenteric or pelvic adenopathy. Abdominal aorta is normal in caliber. Reproductive: Central prostatic calcifications. Prostate gland appears prominent size measuring 5.2 x 4.4 cm. Bladder: Physiologically distended without wall thickening. Other: No free air, free fluid, or intra-abdominal fluid collection. Tiny fat containing umbilical hernia. Musculoskeletal: There are no  acute or suspicious osseous abnormalities. IMPRESSION: 1.  No renal stones or obstructive uropathy. 2. Minimal diverticulosis in the descending and sigmoid colon without acute diverticulitis. 3. Mildly enlarged prostate gland. Electronically Signed   By: Rubye Oaks M.D.   On: 07/07/2016 23:49        Scheduled Meds: . sodium chloride   Intravenous STAT  . cefTRIAXone (ROCEPHIN)  IV  1 g Intravenous Q24H  . insulin aspart  0-9 Units Subcutaneous TID WC   Continuous Infusions: . sodium chloride 100 mL/hr at 07/08/16 0455     LOS: 0 days    Time spent: 35 minutes    Nyeli Holtmeyer A, MD [facility] Pager  [phone]  If 7PM-7AM, please contact night-coverage [email or web address] Password [facility] 07/08/2016, 12:53 PM

## 2016-07-09 DIAGNOSIS — N12 Tubulo-interstitial nephritis, not specified as acute or chronic: Secondary | ICD-10-CM

## 2016-07-09 DIAGNOSIS — N10 Acute tubulo-interstitial nephritis: Secondary | ICD-10-CM | POA: Diagnosis not present

## 2016-07-09 DIAGNOSIS — I2510 Atherosclerotic heart disease of native coronary artery without angina pectoris: Secondary | ICD-10-CM

## 2016-07-09 DIAGNOSIS — I7300 Raynaud's syndrome without gangrene: Secondary | ICD-10-CM

## 2016-07-09 DIAGNOSIS — E119 Type 2 diabetes mellitus without complications: Secondary | ICD-10-CM | POA: Diagnosis not present

## 2016-07-09 DIAGNOSIS — I10 Essential (primary) hypertension: Secondary | ICD-10-CM

## 2016-07-09 LAB — BASIC METABOLIC PANEL
ANION GAP: 9 (ref 5–15)
BUN: 15 mg/dL (ref 6–20)
CO2: 22 mmol/L (ref 22–32)
Calcium: 8.5 mg/dL — ABNORMAL LOW (ref 8.9–10.3)
Chloride: 107 mmol/L (ref 101–111)
Creatinine, Ser: 0.8 mg/dL (ref 0.61–1.24)
GFR calc Af Amer: >60 mL/min (ref >60)
GFR calc non Af Amer: >60 mL/min (ref >60)
GLUCOSE: 159 mg/dL — AB (ref 65–99)
POTASSIUM: 4 mmol/L (ref 3.5–5.1)
Sodium: 138 mmol/L (ref 135–145)

## 2016-07-09 LAB — CBC
HEMATOCRIT: 48 % (ref 39.0–52.0)
HEMOGLOBIN: 16.6 g/dL (ref 13.0–17.0)
MCH: 31.9 pg (ref 26.0–34.0)
MCHC: 34.6 g/dL (ref 30.0–36.0)
MCV: 92.1 fL (ref 78.0–100.0)
Platelets: 205 10*3/uL (ref 150–400)
RBC: 5.21 MIL/uL (ref 4.22–5.81)
RDW: 12.5 % (ref 11.5–15.5)
WBC: 15.9 10*3/uL — ABNORMAL HIGH (ref 4.0–10.5)

## 2016-07-09 LAB — GLUCOSE, CAPILLARY: Glucose-Capillary: 158 mg/dL — ABNORMAL HIGH (ref 65–99)

## 2016-07-09 LAB — HEMOGLOBIN A1C
HEMOGLOBIN A1C: 6.8 % — AB (ref 4.8–5.6)
MEAN PLASMA GLUCOSE: 148 mg/dL

## 2016-07-09 MED ORDER — CEFUROXIME AXETIL 500 MG PO TABS: 500.0000 mg | ORAL_TABLET | Freq: Two times a day (BID) | ORAL | 0 refills | Status: DC

## 2016-07-09 NOTE — Progress Notes (Signed)
Zachary Estrada discharged Home per MD order.  Discharge instructions reviewed and discussed with the patient, all questions and concerns answered. Copy of instructions.    Medication List    TAKE these medications   atorvastatin 20 MG tablet Commonly known as:  LIPITOR Take 10 mg by mouth daily.   cefUROXime 500 MG tablet Commonly known as:  CEFTIN Take 1 tablet (500 mg total) by mouth 2 (two) times daily with a meal.   fish oil-omega-3 fatty acids 1000 MG capsule Take 1 g by mouth daily.   fluticasone 50 MCG/ACT nasal spray Commonly known as:  FLONASE Place 1 spray into both nostrils daily.   INVOKAMET 50-1000 MG Tabs Generic drug:  Canagliflozin-Metformin HCl Take 1 tablet by mouth daily.   naproxen sodium 220 MG tablet Commonly known as:  ANAPROX Take 220 mg by mouth daily.   ONE-A-DAY MENS 50+ ADVANTAGE PO Take 1 tablet by mouth daily.   pantoprazole 40 MG tablet Commonly known as:  PROTONIX Take 40 mg by mouth every other day.   potassium chloride 10 MEQ tablet Commonly known as:  K-DUR,KLOR-CON Take 10 mEq by mouth daily.   triamterene-hydrochlorothiazide 37.5-25 MG tablet Commonly known as:  MAXZIDE-25 Take 0.5 tablets by mouth daily.       Patients skin is clean, dry and intact, no evidence of skin break down. IV site discontinued and catheter remains intact. Site without signs and symptoms of complications. Dressing and pressure applied.  Patient escorted to car by NT in a wheelchair,  no distress noted upon discharge.  [name] 07/09/2016 12:22 PM

## 2016-07-09 NOTE — Discharge Summary (Signed)
Physician Discharge Summary  Zachary Estrada:[id number] DOB: Dec 12, 1960 DOA: 07/07/2016  PCP: Henrine Screws, MD  Admit date: 07/07/2016 Discharge date: 07/09/2016  Admitted From: Home Disposition: Home  Recommendations for Outpatient Follow-up:  1. Follow up with PCP in 1-2 weeks 2. Please obtain BMP/CBC in one week  Home Health: Home Equipment/Devices: N/A  Discharge Condition: Stable CODE STATUS: Full code Diet recommendation: Carb modified diet  Brief/Interim Summary: Zachary Estrada is a 55 y.o. male with medical history significant of DM, CAD, HTN comes in after waking up this am and noticed pain when he tried to urinate, not really burning but it was painful.  Through the day he began to feel worse having pain in his left flank with chills and subjective fever.  He has had a kidney infection before but 29 years ago, he has not been on antibiotics recently.  Pt found to have acute pyelonephritis.  He has not been vomiting.    Discharge Diagnoses:  Principal Problem:   Acute pyelonephritis Active Problems:   Coronary artery disease involving native coronary artery of native heart without angina pectoris   Diabetes mellitus without complication (HCC)   Essential hypertension   Raynaud disease   Pyelonephritis   UTI, possible early left-sided acute pyelonephritis -Patient admitted to the hospital with 100, symptoms of UTI. -Urinalysis consistent with UTI with 6-30 pus cells. -Patient mentioned left flank pain but CT scan of abdomen/pelvis did not show stranding around the kidney. -This is considered complicated UTI because he is diabetic male, he is on Invokamet as well. -Treated with Rocephin initially, he is afebrile, WBC trending down. -Discharged home on 10 more days of Ceftin.  Diabetes mellitus type 2 without complications -Appears to be controlled with hemoglobin A1c of 6.8, continue current regimen.  Early sepsis -Met sepsis criteria on admission with WBC  of 24.5 and pulse of 118 and presence of infection. -Lactic acid was normal at 1.3, did not show and organ damage. -This is treated with IV antibiotics and IV fluid hydration, sepsis pathophysiology resolved on discharge.  Hypertension -Stable.  Raynaud disease Stable now acute complaints.   Discharge Instructions  Discharge Instructions    Diet - low sodium heart healthy    Complete by:  As directed   Increase activity slowly    Complete by:  As directed       Medication List    TAKE these medications   atorvastatin 20 MG tablet Commonly known as:  LIPITOR Take 10 mg by mouth daily.   cefUROXime 500 MG tablet Commonly known as:  CEFTIN Take 1 tablet (500 mg total) by mouth 2 (two) times daily with a meal.   fish oil-omega-3 fatty acids 1000 MG capsule Take 1 g by mouth daily.   fluticasone 50 MCG/ACT nasal spray Commonly known as:  FLONASE Place 1 spray into both nostrils daily.   INVOKAMET 50-1000 MG Tabs Generic drug:  Canagliflozin-Metformin HCl Take 1 tablet by mouth daily.   naproxen sodium 220 MG tablet Commonly known as:  ANAPROX Take 220 mg by mouth daily.   ONE-A-DAY MENS 50+ ADVANTAGE PO Take 1 tablet by mouth daily.   pantoprazole 40 MG tablet Commonly known as:  PROTONIX Take 40 mg by mouth every other day.   potassium chloride 10 MEQ tablet Commonly known as:  K-DUR,KLOR-CON Take 10 mEq by mouth daily.   triamterene-hydrochlorothiazide 37.5-25 MG tablet Commonly known as:  MAXZIDE-25 Take 0.5 tablets by mouth daily.  No Known Allergies  Consultations:  None   Procedures/Studies: Ct Renal Stone Study  Result Date: 07/07/2016 CLINICAL DATA:  Progressive left flank pain over the past 10 hours. EXAM: CT ABDOMEN AND PELVIS WITHOUT CONTRAST TECHNIQUE: Multidetector CT imaging of the abdomen and pelvis was performed following the standard protocol without IV contrast. COMPARISON:  CT 04/21/2015 FINDINGS: Lower chest: Subsegmental  linear atelectasis in both lower lobes. Minimal fissural thickening the minor fissure is likely secondary to intrapulmonary lymph node. Liver: Borderline hepatic steatosis.  No evidence of focal lesion. Hepatobiliary: Gallbladder physiologically distended, no calcified stone. No biliary dilatation. Pancreas: No ductal dilatation or inflammation. Mild parenchymal atrophy. Spleen: Normal. Adrenal glands: No nodule. Kidneys: Symmetric in size without stones or hydronephrosis. There is no perinephric stranding. Both ureters are decompressed without stones along the course. Stomach/Bowel: Stomach is decompressed. There are no dilated or thickened small bowel loops. Small volume of stool throughout the colon without colonic wall thickening. Minimal diverticulosis of the descending and sigmoid colon without acute diverticulitis. The appendix is normal. Vascular/Lymphatic: No retroperitoneal, mesenteric or pelvic adenopathy. Abdominal aorta is normal in caliber. Reproductive: Central prostatic calcifications. Prostate gland appears prominent size measuring 5.2 x 4.4 cm. Bladder: Physiologically distended without wall thickening. Other: No free air, free fluid, or intra-abdominal fluid collection. Tiny fat containing umbilical hernia. Musculoskeletal: There are no acute or suspicious osseous abnormalities. IMPRESSION: 1.  No renal stones or obstructive uropathy. 2. Minimal diverticulosis in the descending and sigmoid colon without acute diverticulitis. 3. Mildly enlarged prostate gland. Electronically Signed   By: Jeb Levering M.D.   On: 07/07/2016 23:49    (Echo, Carotid, EGD, Colonoscopy, ERCP)    Subjective:   Discharge Exam: Vitals:   07/08/16 2100 07/09/16 0500  BP: 122/77 120/73  Pulse: 86 83  Resp:    Temp: 98.6 F (37 C) 98.6 F (37 C)   Vitals:   07/08/16 0635 07/08/16 1423 07/08/16 2100 07/09/16 0500  BP: 121/73 118/78 122/77 120/73  Pulse: 92 86 86 83  Resp: 15 18    Temp: 99.3 F (37.4  C) 98.1 F (36.7 C) 98.6 F (37 C) 98.6 F (37 C)  TempSrc: Oral Oral Oral Oral  SpO2: 96% 99% 98% 98%  Weight: 81.3 kg (179 lb 3.2 oz)     Height: _0  (1.727 m)       General: Pt is alert, awake, not in acute distress Cardiovascular: RRR, S1/S2 +, no rubs, no gallops Respiratory: CTA bilaterally, no wheezing, no rhonchi Abdominal: Soft, NT, ND, bowel sounds + Extremities: no edema, no cyanosis    The results of significant diagnostics from this hospitalization (including imaging, microbiology, ancillary and laboratory) are listed below for reference.     Microbiology: Recent Results (from the past 240 hour(s))  Blood culture (routine x 2)     Status: None (Preliminary result)   Collection Time: 07/08/16  1:52 AM  Result Value Ref Range Status   Specimen Description LEFT ANTECUBITAL  Final   Special Requests BOTTLES DRAWN AEROBIC AND ANAEROBIC 6CC  Final   Culture NO GROWTH < 12 HOURS  Final   Report Status PENDING  Incomplete  Blood culture (routine x 2)     Status: None (Preliminary result)   Collection Time: 07/08/16  2:04 AM  Result Value Ref Range Status   Specimen Description BLOOD LEFT ARM  Final   Special Requests BOTTLES DRAWN AEROBIC AND ANAEROBIC 6CC  Final   Culture NO GROWTH < 12 HOURS  Final  Report Status PENDING  Incomplete     Labs: BNP (last 3 results) No results for input(s): BNP in the last 8760 hours. Basic Metabolic Panel:  Recent Labs Lab 07/07/16 2230 07/08/16 0652 07/09/16 0517  NA 132* 136 138  K 3.5 3.7 4.0  CL 100* 105 107  CO2 21* 22 22  GLUCOSE 137* 153* 159*  BUN _0 CREATININE 0.87 0.97 0.80  CALCIUM 9.5 8.3* 8.5*   Liver Function Tests:  Recent Labs Lab 07/07/16 2230  AST 20  ALT 32  ALKPHOS 56  BILITOT 1.1  PROT 7.9  ALBUMIN 4.8   No results for input(s): LIPASE, AMYLASE in the last 168 hours. No results for input(s): AMMONIA in the last 168 hours. CBC:  Recent Labs Lab 07/07/16 2230 07/08/16 0652  07/09/16 0517  WBC 26.2* 24.8* 15.9*  NEUTROABS 23.0*  --   --   HGB 18.7* 16.5 16.6  HCT 51.6 46.2 48.0  MCV 89.9 90.6 92.1  PLT 224 204 205   Cardiac Enzymes: No results for input(s): CKTOTAL, CKMB, CKMBINDEX, TROPONINI in the last 168 hours. BNP: Invalid input(s): POCBNP CBG:  Recent Labs Lab 07/08/16 1156 07/08/16 1606 07/08/16 2207 07/09/16 0817  GLUCAP 168* 148* 193* 158*   D-Dimer No results for input(s): DDIMER in the last 72 hours. Hgb A1c  Recent Labs  07/08/16 0652  HGBA1C 6.8*   Lipid Profile No results for input(s): CHOL, HDL, LDLCALC, TRIG, CHOLHDL, LDLDIRECT in the last 72 hours. Thyroid function studies No results for input(s): TSH, T4TOTAL, T3FREE, THYROIDAB in the last 72 hours.  Invalid input(s): FREET3 Anemia work up No results for input(s): VITAMINB12, FOLATE, FERRITIN, TIBC, IRON, RETICCTPCT in the last 72 hours. Urinalysis    Component Value Date/Time   COLORURINE YELLOW 07/07/2016 2040   APPEARANCEUR HAZY (A) 07/07/2016 2040   LABSPEC 1.015 07/07/2016 2040   PHURINE 6.5 07/07/2016 2040   GLUCOSEU >1000 (A) 07/07/2016 2040   HGBUR MODERATE (A) 07/07/2016 2040   BILIRUBINUR NEGATIVE 07/07/2016 2040   KETONESUR NEGATIVE 07/07/2016 2040   PROTEINUR NEGATIVE 07/07/2016 2040   UROBILINOGEN 0.2 04/21/2015 1400   NITRITE NEGATIVE 07/07/2016 2040   LEUKOCYTESUR MODERATE (A) 07/07/2016 2040   Sepsis Labs Invalid input(s): PROCALCITONIN,  WBC,  LACTICIDVEN Microbiology Recent Results (from the past 240 hour(s))  Blood culture (routine x 2)     Status: None (Preliminary result)   Collection Time: 07/08/16  1:52 AM  Result Value Ref Range Status   Specimen Description LEFT ANTECUBITAL  Final   Special Requests BOTTLES DRAWN AEROBIC AND ANAEROBIC 6CC  Final   Culture NO GROWTH < 12 HOURS  Final   Report Status PENDING  Incomplete  Blood culture (routine x 2)     Status: None (Preliminary result)   Collection Time: 07/08/16  2:04 AM   Result Value Ref Range Status   Specimen Description BLOOD LEFT ARM  Final   Special Requests BOTTLES DRAWN AEROBIC AND ANAEROBIC 6CC  Final   Culture NO GROWTH < 12 HOURS  Final   Report Status PENDING  Incomplete     Time coordinating discharge: Over 30 minutes  SIGNED:   Birdie Hopes, MD  [facility] 07/09/2016, 10:24 AM Pager   If 7PM-7AM, please contact night-coverage [email or web address] Password [email or web address]

## 2016-07-10 LAB — URINE CULTURE

## 2016-07-13 LAB — CULTURE, BLOOD (ROUTINE X 2)
CULTURE: NO GROWTH
Culture: NO GROWTH

## 2021-06-11 ENCOUNTER — Encounter (HOSPITAL_BASED_OUTPATIENT_CLINIC_OR_DEPARTMENT_OTHER): Payer: Self-pay | Admitting: Emergency Medicine

## 2021-06-11 ENCOUNTER — Other Ambulatory Visit: Payer: Self-pay

## 2021-06-11 ENCOUNTER — Emergency Department (HOSPITAL_BASED_OUTPATIENT_CLINIC_OR_DEPARTMENT_OTHER)
Admission: EM | Admit: 2021-06-11 | Discharge: 2021-06-11 | Disposition: A | Discharge status: Home / Self Care | Payer: Commercial Managed Care - PPO | Attending: Emergency Medicine | Admitting: Emergency Medicine

## 2021-06-11 ENCOUNTER — Emergency Department (HOSPITAL_BASED_OUTPATIENT_CLINIC_OR_DEPARTMENT_OTHER): Payer: Commercial Managed Care - PPO | Admitting: Radiology

## 2021-06-11 ENCOUNTER — Emergency Department (HOSPITAL_BASED_OUTPATIENT_CLINIC_OR_DEPARTMENT_OTHER): Payer: Commercial Managed Care - PPO

## 2021-06-11 DIAGNOSIS — U071 COVID-19: Secondary | ICD-10-CM

## 2021-06-11 DIAGNOSIS — I2510 Atherosclerotic heart disease of native coronary artery without angina pectoris: Secondary | ICD-10-CM | POA: Insufficient documentation

## 2021-06-11 DIAGNOSIS — E119 Type 2 diabetes mellitus without complications: Secondary | ICD-10-CM | POA: Diagnosis not present

## 2021-06-11 DIAGNOSIS — M546 Pain in thoracic spine: Secondary | ICD-10-CM

## 2021-06-11 DIAGNOSIS — I10 Essential (primary) hypertension: Secondary | ICD-10-CM | POA: Diagnosis not present

## 2021-06-11 DIAGNOSIS — M549 Dorsalgia, unspecified: Secondary | ICD-10-CM | POA: Diagnosis present

## 2021-06-11 LAB — RESP PANEL BY RT-PCR (FLU A&B, COVID) ARPGX2
Influenza A by PCR: NEGATIVE
Influenza B by PCR: NEGATIVE
SARS Coronavirus 2 by RT PCR: POSITIVE — AB

## 2021-06-11 LAB — TROPONIN I (HIGH SENSITIVITY)
Troponin I (High Sensitivity): 3 ng/L (ref <18)
Troponin I (High Sensitivity): 3 ng/L (ref <18)

## 2021-06-11 LAB — CBC
HCT: 48 % (ref 39.0–52.0)
Hemoglobin: 16.8 g/dL (ref 13.0–17.0)
MCH: 30.9 pg (ref 26.0–34.0)
MCHC: 35 g/dL (ref 30.0–36.0)
MCV: 88.4 fL (ref 80.0–100.0)
Platelets: 335 10*3/uL (ref 150–400)
RBC: 5.43 MIL/uL (ref 4.22–5.81)
RDW: 12.1 % (ref 11.5–15.5)
WBC: 8.1 10*3/uL (ref 4.0–10.5)
nRBC: 0 % (ref 0.0–0.2)

## 2021-06-11 LAB — BASIC METABOLIC PANEL
Anion gap: 12 (ref 5–15)
BUN: 17 mg/dL (ref 6–20)
CO2: 23 mmol/L (ref 22–32)
Calcium: 9.6 mg/dL (ref 8.9–10.3)
Chloride: 100 mmol/L (ref 98–111)
Creatinine, Ser: 1.01 mg/dL (ref 0.61–1.24)
GFR, Estimated: >60 mL/min (ref >60)
Glucose, Bld: 158 mg/dL — ABNORMAL HIGH (ref 70–99)
Potassium: 4 mmol/L (ref 3.5–5.1)
Sodium: 135 mmol/L (ref 135–145)

## 2021-06-11 MED ORDER — NIRMATRELVIR/RITONAVIR (PAXLOVID)TABLET: 3.0000 | ORAL_TABLET | Freq: Two times a day (BID) | ORAL | 0 refills | Status: AC

## 2021-06-11 MED ORDER — FENTANYL CITRATE (PF) 100 MCG/2ML IJ SOLN
50.0000 ug | Freq: Once | INTRAMUSCULAR | Status: AC
  Administered 2021-06-11: 50 ug via INTRAVENOUS
  Filled 2021-06-11: qty 2

## 2021-06-11 MED ORDER — IOHEXOL 350 MG/ML SOLN
100.0000 mL | Freq: Once | INTRAVENOUS | Status: AC | PRN
  Administered 2021-06-11: 100 mL via INTRAVENOUS

## 2021-06-11 MED ORDER — HYDROCODONE-ACETAMINOPHEN 5-325 MG PO TABS: 1.0000 | ORAL_TABLET | ORAL | 0 refills | Status: DC | PRN

## 2021-06-11 NOTE — Discharge Instructions (Addendum)
Stop taking your Lipitor while taking the Paxlovid.  Return to emergency room if you have any worsening symptoms.

## 2021-06-11 NOTE — ED Provider Notes (Signed)
[facility] EMERGENCY DEPT Provider Note   CSN: [id number] Arrival date & time: 06/11/21  1344     History Chief Complaint  Patient presents with   Shortness of Breath    Zachary Estrada is a 60 y.o. male.  Patient is a 60 year old male with a history of diabetes, hypertension, coronary artery disease and prior pneumothorax who presents with back pain.  He states he had a cough for the last 2 to 3 days.  Last night he developed pain between his shoulder blades.  It radiates to his chest.  He describes as a sharp pain.  Its not worse with movement.  Its not worse with breathing.  He did start having some shortness of breath today while he was at the gym.  He denies any known fevers.  No leg swelling.  No history of similar symptoms in the past.  He has been using over-the-counter medicines with no improvement in symptoms.  His pain is been progressively getting worse throughout the day.      Past Medical History:  Diagnosis Date   Coronary artery disease    Diabetes mellitus without complication (HCC)    GERD (gastroesophageal reflux disease)    Hypertension    Pneumothorax, acute    Raynaud disease     Patient Active Problem List   Diagnosis Date Noted   Acute pyelonephritis 07/08/2016   Pyelonephritis 07/08/2016   Coronary artery disease involving native coronary artery of native heart without angina pectoris    Diabetes mellitus without complication (HCC)    Essential hypertension    Raynaud disease     Past Surgical History:  Procedure Laterality Date   BACK SURGERY         No family history on file.  Social History   Tobacco Use   Smoking status: Never   Smokeless tobacco: Never  Substance Use Topics   Alcohol use: Yes    Home Medications Prior to Admission medications   Medication Sig Start Date End Date Taking? Authorizing Provider  HYDROcodone-acetaminophen (NORCO/VICODIN) 5-325 MG tablet Take 1 tablet by mouth every 4 (four) hours as  needed. 06/11/21  Yes Rolan Bucco, MD  nirmatrelvir/ritonavir EUA (PAXLOVID) TABS Take 3 tablets by mouth 2 (two) times daily for 5 days. Patient GFR is 60. Take nirmatrelvir (150 mg) two tablets twice daily for 5 days and ritonavir (100 mg) one tablet twice daily for 5 days. 06/11/21 06/16/21 Yes Rolan Bucco, MD  atorvastatin (LIPITOR) 20 MG tablet Take 10 mg by mouth daily.    [provider]  cefUROXime (CEFTIN) 500 MG tablet Take 1 tablet (500 mg total) by mouth 2 (two) times daily with a meal. 07/09/16   Clydia Llano, MD  fish oil-omega-3 fatty acids 1000 MG capsule Take 1 g by mouth daily.    [provider]  fluticasone (FLONASE) 50 MCG/ACT nasal spray Place 1 spray into both nostrils daily.    [provider]  INVOKAMET 50-1000 MG TABS Take 1 tablet by mouth daily.  04/10/15   [provider]  Multiple Vitamins-Minerals (ONE-A-DAY MENS 50+ ADVANTAGE PO) Take 1 tablet by mouth daily.    [provider]  naproxen sodium (ANAPROX) 220 MG tablet Take 220 mg by mouth daily.    [provider]  pantoprazole (PROTONIX) 40 MG tablet Take 40 mg by mouth every other day.  07/07/12   Peyton Najjar, MD  potassium chloride (K-DUR,KLOR-CON) 10 MEQ tablet Take 10 mEq by mouth daily.  [provider]  triamterene-hydrochlorothiazide (MAXZIDE-25) 37.5-25 MG per tablet Take 0.5 tablets by mouth daily.     [provider]    Allergies    Patient has no known allergies.  Review of Systems   Review of Systems  Constitutional:  Positive for fatigue. Negative for chills, diaphoresis and fever.  HENT:  Negative for congestion, rhinorrhea and sneezing.   Eyes: Negative.   Respiratory:  Positive for cough and shortness of breath. Negative for chest tightness.   Cardiovascular:  Positive for chest pain. Negative for leg swelling.  Gastrointestinal:  Negative for abdominal pain, blood in stool, diarrhea, nausea and vomiting.   Genitourinary:  Negative for difficulty urinating, flank pain, frequency and hematuria.  Musculoskeletal:  Positive for back pain. Negative for arthralgias.  Skin:  Negative for rash.  Neurological:  Negative for dizziness, speech difficulty, weakness, numbness and headaches.   Physical Exam Updated Vital Signs BP (!) 141/93 (BP Location: Right Arm)   Pulse 88   Temp 99.4 F (37.4 C) (Oral)   Resp 11   Ht 5\' 8"  (1.727 m)   Wt 79.8 kg   SpO2 99%   BMI 26.76 kg/m   Physical Exam Constitutional:      Appearance: He is well-developed.     Comments: Appears uncomfortable  HENT:     Head: Normocephalic and atraumatic.  Eyes:     Pupils: Pupils are equal, round, and reactive to light.  Cardiovascular:     Rate and Rhythm: Normal rate and regular rhythm.     Heart sounds: Normal heart sounds.  Pulmonary:     Effort: Pulmonary effort is normal. No respiratory distress.     Breath sounds: Normal breath sounds. No wheezing or rales.  Chest:     Chest wall: No tenderness.     Comments: There is some tenderness on palpation of the left anterior chest wall.  There is no pain to the posterior chest/back.  No tenderness along the spine.  No tenderness along the musculature. Abdominal:     General: Bowel sounds are normal.     Palpations: Abdomen is soft.     Tenderness: There is no abdominal tenderness. There is no guarding or rebound.  Musculoskeletal:        General: Normal range of motion.     Cervical back: Normal range of motion and neck supple.     Comments: No edema or calf tenderness  Lymphadenopathy:     Cervical: No cervical adenopathy.  Skin:    General: Skin is warm and dry.     Findings: No rash.  Neurological:     Mental Status: He is alert and oriented to person, place, and time.    ED Results / Procedures / Treatments   Labs (all labs ordered are listed, but only abnormal results are displayed) Labs Reviewed  RESP PANEL BY RT-PCR (FLU A&B, COVID) ARPGX2 -  Abnormal; Notable for the following components:      Result Value   SARS Coronavirus 2 by RT PCR POSITIVE (*)    All other components within normal limits  BASIC METABOLIC PANEL - Abnormal; Notable for the following components:   Glucose, Bld 158 (*)    All other components within normal limits  CBC  TROPONIN I (HIGH SENSITIVITY)  TROPONIN I (HIGH SENSITIVITY)    EKG EKG Interpretation  Date/Time:  Monday June 11 2021 13:54:24 EDT Ventricular Rate:  107 PR Interval:  136 QRS Duration: 82 QT Interval:  318 QTC  Calculation: 424 R Axis:   67 Text Interpretation: Sinus tachycardia Otherwise normal ECG SINCE LAST TRACING HEART RATE HAS INCREASED Confirmed by Rolan BuccoBelfi, Shuntia Exton [phone] on 06/11/2021 4:23:10 PM  Radiology DG Chest 2 View  Result Date: 06/11/2021 CLINICAL DATA:  Shortness of breath with cough, chest and left arm pain. EXAM: CHEST - 2 VIEW COMPARISON:  Radiographs 04/23/2010 and 04/22/2010. FINDINGS: The heart size and mediastinal contours are normal. The lungs are clear. There is no pleural effusion or pneumothorax. No acute osseous findings are identified. IMPRESSION: Stable chest.  No active cardiopulmonary process. Electronically Signed   By: Carey BullocksWilliam  Veazey M.D.   On: 06/11/2021 14:43   CT Angio Chest Aorta w/CM &/OR wo/CM  Result Date: 06/11/2021 CLINICAL DATA:  Chest pain and back pain. Concern for aortic dissection. COVID-19 positive EXAM: CT ANGIOGRAPHY CHEST WITH CONTRAST TECHNIQUE: Multidetector CT imaging of the chest was performed using the standard protocol during bolus administration of intravenous contrast. Multiplanar CT image reconstructions and MIPs were obtained to evaluate the vascular anatomy. CONTRAST:  100mL OMNIPAQUE IOHEXOL 350 MG/ML SOLN COMPARISON:  Same day chest x-ray FINDINGS: Cardiovascular: No aortic intramural hemotoma evident on noncontrast axial images. Preferential opacification of the thoracic aorta. No evidence of thoracic aortic aneurysm or  dissection. Common origin of the brachiocephalic and left common carotid arteries, an anatomic variant. Normal heart size. No pericardial effusion. Minimal coronary artery calcification. Mediastinum/Nodes: No enlarged mediastinal, hilar, or axillary lymph nodes. Thyroid gland, trachea, and esophagus demonstrate no significant findings. Lungs/Pleura: Lungs are clear without focal airspace consolidation. Tiny nodule along the minor fissure, likely perifissural lymph node (series 6, image 87). No suspicious pulmonary nodules. No pleural effusion or pneumothorax. Upper Abdomen: No acute abnormality. Musculoskeletal: No chest wall abnormality. No acute or significant osseous findings. Review of the MIP images confirms the above findings. IMPRESSION: 1. Negative for aortic aneurysm or dissection. 2. Lungs are clear. No acute intrathoracic findings. 3. Minimal coronary artery calcification. Electronically Signed   By: Duanne GuessNicholas  Plundo D.O.   On: 06/11/2021 17:44    Procedures Procedures   Medications Ordered in ED Medications  fentaNYL (SUBLIMAZE) injection 50 mcg (50 mcg Intravenous Given 06/11/21 1629)  iohexol (OMNIPAQUE) 350 MG/ML injection 100 mL (100 mLs Intravenous Contrast Given 06/11/21 1644)    ED Course  I have reviewed the triage vital signs and the nursing notes.  Pertinent labs & imaging results that were available during my care of the patient were reviewed by me and considered in my medical decision making (see chart for details).    MDM Rules/Calculators/A&P                          Patient is a 60 year old male who presents with cough for the last 2 to 3 days and today had some upper back pain since yesterday.  It did not really seem to be worse with coughing.  It was not reproducible on palpation.  He did have some associated shortness of breath.  Chest x-ray showed no acute abnormalities.  This is interpreted by me.  He had a CTA of the chest which showed no evidence of dissection.  No  evidence of PE.  He has no hypoxia.  This is likely musculoskeletal.  He was given a prescription for Vicodin for pain.  Given his risk factors, he was started on Paxlovid.  Return precautions and quarantine instructions were given. Final Clinical Impression(s) / ED Diagnoses Final diagnoses:  COVID-19 virus infection  Acute bilateral thoracic back pain    Rx / DC Orders ED Discharge Orders          Ordered    nirmatrelvir/ritonavir EUA (PAXLOVID) TABS  2 times daily        06/11/21 1804    HYDROcodone-acetaminophen (NORCO/VICODIN) 5-325 MG tablet  Every 4 hours PRN        06/11/21 1804             Rolan Bucco, MD 06/11/21 1810

## 2021-06-11 NOTE — ED Triage Notes (Signed)
SOB, cough, states it feels like a "hot blade on his shoulder blades". This started last night.

## 2021-12-04 ENCOUNTER — Ambulatory Visit (INDEPENDENT_AMBULATORY_CARE_PROVIDER_SITE_OTHER): Payer: Commercial Managed Care - PPO | Admitting: Family Medicine

## 2021-12-04 ENCOUNTER — Encounter: Payer: Self-pay | Admitting: Family Medicine

## 2021-12-04 VITALS — BP 128/83 | HR 82 | Temp 97.7°F | Ht 69.0 in | Wt 183.6 lb

## 2021-12-04 DIAGNOSIS — K219 Gastro-esophageal reflux disease without esophagitis: Secondary | ICD-10-CM | POA: Insufficient documentation

## 2021-12-04 DIAGNOSIS — E1159 Type 2 diabetes mellitus with other circulatory complications: Secondary | ICD-10-CM

## 2021-12-04 DIAGNOSIS — E785 Hyperlipidemia, unspecified: Secondary | ICD-10-CM

## 2021-12-04 DIAGNOSIS — I152 Hypertension secondary to endocrine disorders: Secondary | ICD-10-CM

## 2021-12-04 DIAGNOSIS — G9332 Myalgic encephalomyelitis/chronic fatigue syndrome: Secondary | ICD-10-CM | POA: Insufficient documentation

## 2021-12-04 DIAGNOSIS — J309 Allergic rhinitis, unspecified: Secondary | ICD-10-CM | POA: Insufficient documentation

## 2021-12-04 DIAGNOSIS — E1169 Type 2 diabetes mellitus with other specified complication: Secondary | ICD-10-CM | POA: Diagnosis not present

## 2021-12-04 LAB — BAYER DCA HB A1C WAIVED: HB A1C (BAYER DCA - WAIVED): 10.4 % — ABNORMAL HIGH (ref 4.8–5.6)

## 2021-12-04 NOTE — Patient Instructions (Signed)

## 2021-12-04 NOTE — Progress Notes (Signed)
Subjective:  Patient ID: Zachary Estrada, male    DOB: 20-Jan-1961, 61 y.o.   MRN: [id number]  Patient Care Team: Baruch Gouty, FNP as PCP - General (Family Medicine)   Chief Complaint:  New Patient (Initial Visit) (Establish patient care//Type 2 diabetes/Family history of heart disease, cancer, dementia)   HPI: Zachary Estrada is a 61 y.o. male presenting on 12/04/2021 for New Patient (Initial Visit) (Establish patient care//Type 2 diabetes/Family history of heart disease, cancer, dementia)   Patient presents today to establish care with new PCP.  He was formally followed by Dr. Inda Merlin and last saw him in July.  He has a history of type 2 diabetes with associated hypertension and hyperlipidemia.  He also has a history of well-controlled acid reflux without esophagitis.  States he has had lumbar issues in the past with degenerative disc disease.  He has also had a former left-sided spontaneous pneumothorax which required chest tube.  His diabetes is managed with metformin and Amaryl.  States his blood sugar at home today was 173 but he does have high readings at times.  He does workout at least 4 times a week with cardio and weightlifting.  He tries to follow a decent diet.  He denies any polyuria, polyphagia, polydipsia.  No neuropathic pain.  He does see Dr. Marica Otter yearly for eye exams.  He does not have any specific complaints or concerns today.  Last labs over 6 months ago.    Relevant past medical, surgical, family, and social history reviewed and updated as indicated.  Allergies and medications reviewed and updated. Data reviewed: Chart in Epic.   Past Medical History:  Diagnosis Date   Allergy    Coronary artery disease    Diabetes mellitus without complication (HCC)    GERD (gastroesophageal reflux disease)    Hypertension    Pneumothorax, acute    Raynaud disease     Past Surgical History:  Procedure Laterality Date   BACK SURGERY     PLEURAL SCARIFICATION Left      Social History   Socioeconomic History   Marital status: Single    Spouse name: Not on file   Number of children: Not on file   Years of education: Not on file   Highest education level: Not on file  Occupational History   Not on file  Tobacco Use   Smoking status: Never   Smokeless tobacco: Never  Substance and Sexual Activity   Alcohol use: Yes   Drug use: Never   Sexual activity: Yes  Other Topics Concern   Not on file  Social History Narrative   Not on file   Social Determinants of Health   Financial Resource Strain: Not on file  Food Insecurity: Not on file  Transportation Needs: Not on file  Physical Activity: Not on file  Stress: Not on file  Social Connections: Not on file  Intimate Partner Violence: Not on file    Outpatient Encounter Medications as of 12/04/2021  Medication Sig   atorvastatin (LIPITOR) 20 MG tablet Take 10 mg by mouth daily.   fish oil-omega-3 fatty acids 1000 MG capsule Take 1 g by mouth daily.   fluticasone (FLONASE) 50 MCG/ACT nasal spray Place 1 spray into both nostrils daily.   glimepiride (AMARYL) 2 MG tablet Take 2 mg by mouth daily with breakfast.   METFORMIN HCL ER PO Take 1,000 mg by mouth 2 (two) times daily.   Multiple Vitamins-Minerals (ONE-A-DAY MENS 50+ ADVANTAGE  PO) Take 1 tablet by mouth daily.   potassium chloride (K-DUR,KLOR-CON) 10 MEQ tablet Take 10 mEq by mouth daily.    triamterene-hydrochlorothiazide (MAXZIDE-25) 37.5-25 MG per tablet Take 0.5 tablets by mouth daily.    [DISCONTINUED] cefUROXime (CEFTIN) 500 MG tablet Take 1 tablet (500 mg total) by mouth 2 (two) times daily with a meal.   [DISCONTINUED] HYDROcodone-acetaminophen (NORCO/VICODIN) 5-325 MG tablet Take 1 tablet by mouth every 4 (four) hours as needed. (Patient not taking: Reported on 12/04/2021)   [DISCONTINUED] INVOKAMET 50-1000 MG TABS Take 1 tablet by mouth daily.    [DISCONTINUED] naproxen sodium (ANAPROX) 220 MG tablet Take 220 mg by mouth daily.    [DISCONTINUED] pantoprazole (PROTONIX) 40 MG tablet Take 40 mg by mouth every other day.    No facility-administered encounter medications on file as of 12/04/2021.    Allergies  Allergen Reactions   Invokamet [Canagliflozin-Metformin Hcl]     Review of Systems  Constitutional:  Negative for activity change, appetite change, chills, diaphoresis, fatigue, fever and unexpected weight change.  HENT: Negative.    Eyes: Negative.  Negative for photophobia and visual disturbance.  Respiratory:  Negative for cough, chest tightness and shortness of breath.   Cardiovascular:  Negative for chest pain, palpitations and leg swelling.  Gastrointestinal:  Negative for abdominal pain, blood in stool, constipation, diarrhea, nausea and vomiting.  Endocrine: Negative.  Negative for cold intolerance, heat intolerance, polydipsia, polyphagia and polyuria.  Genitourinary:  Negative for decreased urine volume, difficulty urinating, dysuria, frequency and urgency.  Musculoskeletal:  Negative for arthralgias and myalgias.  Skin: Negative.   Allergic/Immunologic: Negative.   Neurological:  Negative for dizziness, weakness and headaches.  Hematological: Negative.   Psychiatric/Behavioral:  Negative for confusion, hallucinations, sleep disturbance and suicidal ideas.   All other systems reviewed and are negative.      Objective:  BP 128/83    Pulse 82    Temp 97.7 F (36.5 C)    Ht '5\' 9"'  (1.753 m)    Wt 183 lb 9.6 oz (83.3 kg)    SpO2 97%    BMI 27.11 kg/m    Wt Readings from Last 3 Encounters:  12/04/21 183 lb 9.6 oz (83.3 kg)  06/11/21 176 lb (79.8 kg)  07/08/16 179 lb 3.2 oz (81.3 kg)    Physical Exam Vitals and nursing note reviewed.  Constitutional:      General: He is not in acute distress.    Appearance: Normal appearance. He is well-developed, well-groomed and overweight. He is not ill-appearing, toxic-appearing or diaphoretic.  HENT:     Head: Normocephalic and atraumatic.     Jaw: There  is normal jaw occlusion.     Right Ear: Hearing, tympanic membrane, ear canal and external ear normal.     Left Ear: Hearing, tympanic membrane, ear canal and external ear normal.     Nose: Nose normal.     Mouth/Throat:     Lips: Pink.     Mouth: Mucous membranes are moist.     Pharynx: Oropharynx is clear. Uvula midline.  Eyes:     General: Lids are normal.     Extraocular Movements: Extraocular movements intact.     Conjunctiva/sclera: Conjunctivae normal.     Pupils: Pupils are equal, round, and reactive to light.  Neck:     Thyroid: No thyroid mass, thyromegaly or thyroid tenderness.     Vascular: No carotid bruit or JVD.     Trachea: Trachea and phonation normal.  Cardiovascular:  Rate and Rhythm: Normal rate and regular rhythm.     Chest Wall: PMI is not displaced.     Pulses: Normal pulses.     Heart sounds: Normal heart sounds. No murmur heard.   No friction rub. No gallop.  Pulmonary:     Effort: Pulmonary effort is normal. No respiratory distress.     Breath sounds: Normal breath sounds. No wheezing.  Abdominal:     General: Bowel sounds are normal. There is no distension or abdominal bruit.     Palpations: Abdomen is soft. There is no hepatomegaly or splenomegaly.     Tenderness: There is no abdominal tenderness. There is no right CVA tenderness or left CVA tenderness.     Hernia: No hernia is present.  Musculoskeletal:        General: Normal range of motion.     Cervical back: Normal range of motion and neck supple.     Right lower leg: No edema.     Left lower leg: No edema.  Lymphadenopathy:     Cervical: No cervical adenopathy.  Skin:    General: Skin is warm and dry.     Capillary Refill: Capillary refill takes less than 2 seconds.     Coloration: Skin is not cyanotic, jaundiced or pale.     Findings: No rash.  Neurological:     General: No focal deficit present.     Mental Status: He is alert and oriented to person, place, and time.     Sensory:  Sensation is intact.     Motor: Motor function is intact.     Coordination: Coordination is intact.     Gait: Gait is intact.     Deep Tendon Reflexes: Reflexes are normal and symmetric.  Psychiatric:        Attention and Perception: Attention and perception normal.        Mood and Affect: Mood and affect normal.        Speech: Speech normal.        Behavior: Behavior normal. Behavior is cooperative.        Thought Content: Thought content normal.        Cognition and Memory: Cognition and memory normal.        Judgment: Judgment normal.    Results for orders placed or performed in visit on 12/04/21  Bayer DCA Hb A1c Waived  Result Value Ref Range   HB A1C (BAYER DCA - WAIVED) 10.4 (H) 4.8 - 5.6 %       Pertinent labs & imaging results that were available during my care of the patient were reviewed by me and considered in my medical decision making.  Assessment & Plan:  Shawan was seen today for new patient (initial visit).  Diagnoses and all orders for this visit:  Type 2 diabetes mellitus with other specified complication, without long-term current use of insulin ([place]) Diet and exercise encouraged.  Labs pending.  Will adjust regimen if warranted.  Follow-up in 3 months for reevaluation -     CBC with Differential/Platelet -     CMP14+EGFR -     Lipid panel -     Microalbumin / creatinine urine ratio -     Thyroid Panel With TSH -     Bayer DCA Hb A1c Waived  Hypertension associated with type 2 diabetes mellitus ([place]) Well-controlled on current regimen. DASH Diet and exercise encouraged.  Labs pending. -     CBC with Differential/Platelet -  CMP14+EGFR -     Lipid panel -     Microalbumin / creatinine urine ratio -     Thyroid Panel With TSH  Hyperlipidemia associated with type 2 diabetes mellitus ([place]) Will check labs today.  Will adjust statin therapy if warranted.  Diet and exercise encouraged. -     Lipid panel     Continue all other maintenance  medications.  Follow up plan: Return in about 3 months (around 03/04/2022), or if symptoms worsen or fail to improve, for DM.   Continue healthy lifestyle choices, including diet (rich in fruits, vegetables, and lean proteins, and low in salt and simple carbohydrates) and exercise (at least 30 minutes of moderate physical activity daily).  Educational handout given for DM  The above assessment and management plan was discussed with the patient. The patient verbalized understanding of and has agreed to the management plan. Patient is aware to call the clinic if they develop any new symptoms or if symptoms persist or worsen. Patient is aware when to return to the clinic for a follow-up visit. Patient educated on when it is appropriate to go to the emergency department.   Monia Pouch, FNP-C [facility] [phone]

## 2021-12-05 ENCOUNTER — Encounter: Payer: Self-pay | Admitting: Family Medicine

## 2021-12-05 LAB — CMP14+EGFR
ALT: 19 IU/L (ref 0–44)
AST: 17 IU/L (ref 0–40)
Albumin/Globulin Ratio: 2.1 (ref 1.2–2.2)
Albumin: 4.7 g/dL (ref 3.8–4.9)
Alkaline Phosphatase: 61 IU/L (ref 44–121)
BUN/Creatinine Ratio: 13 (ref 9–20)
BUN: 15 mg/dL (ref 6–24)
Bilirubin Total: 0.6 mg/dL (ref 0.0–1.2)
CO2: 24 mmol/L (ref 20–29)
Calcium: 9.6 mg/dL (ref 8.7–10.2)
Chloride: 97 mmol/L (ref 96–106)
Creatinine, Ser: 1.13 mg/dL (ref 0.76–1.27)
Globulin, Total: 2.2 g/dL (ref 1.5–4.5)
Glucose: 173 mg/dL — ABNORMAL HIGH (ref 70–99)
Potassium: 4.3 mmol/L (ref 3.5–5.2)
Sodium: 137 mmol/L (ref 134–144)
Total Protein: 6.9 g/dL (ref 6.0–8.5)
eGFR: 75 mL/min/{1.73_m2} (ref >59)

## 2021-12-05 LAB — LIPID PANEL
Chol/HDL Ratio: 4.4 ratio (ref 0.0–5.0)
Cholesterol, Total: 153 mg/dL (ref 100–199)
HDL: 35 mg/dL — ABNORMAL LOW (ref >39)
LDL Chol Calc (NIH): 94 mg/dL (ref 0–99)
Triglycerides: 137 mg/dL (ref 0–149)
VLDL Cholesterol Cal: 24 mg/dL (ref 5–40)

## 2021-12-05 LAB — CBC WITH DIFFERENTIAL/PLATELET
Basophils Absolute: 0.1 10*3/uL (ref 0.0–0.2)
Basos: 1 %
EOS (ABSOLUTE): 0.3 10*3/uL (ref 0.0–0.4)
Eos: 3 %
Hematocrit: 49.8 % (ref 37.5–51.0)
Hemoglobin: 17.4 g/dL (ref 13.0–17.7)
Immature Grans (Abs): 0 10*3/uL (ref 0.0–0.1)
Immature Granulocytes: 0 %
Lymphocytes Absolute: 2.7 10*3/uL (ref 0.7–3.1)
Lymphs: 30 %
MCH: 32.2 pg (ref 26.6–33.0)
MCHC: 34.9 g/dL (ref 31.5–35.7)
MCV: 92 fL (ref 79–97)
Monocytes Absolute: 0.9 10*3/uL (ref 0.1–0.9)
Monocytes: 10 %
Neutrophils Absolute: 5.2 10*3/uL (ref 1.4–7.0)
Neutrophils: 56 %
Platelets: 303 10*3/uL (ref 150–450)
RBC: 5.41 x10E6/uL (ref 4.14–5.80)
RDW: 12 % (ref 11.6–15.4)
WBC: 9.2 10*3/uL (ref 3.4–10.8)

## 2021-12-05 LAB — THYROID PANEL WITH TSH
Free Thyroxine Index: 1.9 (ref 1.2–4.9)
T3 Uptake Ratio: 25 % (ref 24–39)
T4, Total: 7.7 ug/dL (ref 4.5–12.0)
TSH: 1.72 u[IU]/mL (ref 0.450–4.500)

## 2021-12-05 MED ORDER — GLIMEPIRIDE 4 MG PO TABS: 4.0000 mg | ORAL_TABLET | Freq: Every day | ORAL | 1 refills | Status: DC

## 2021-12-05 NOTE — Telephone Encounter (Signed)
Zachary Estrada just spoke to pt on the phone regarding test results and regarding above message, will close encounter.

## 2021-12-05 NOTE — Addendum Note (Signed)
Addended by: Sonny Masters on: 12/05/2021 08:00 AM   Modules accepted: Orders

## 2021-12-06 ENCOUNTER — Other Ambulatory Visit: Payer: Self-pay | Admitting: *Deleted

## 2021-12-06 DIAGNOSIS — E1169 Type 2 diabetes mellitus with other specified complication: Secondary | ICD-10-CM

## 2021-12-06 LAB — MICROALBUMIN / CREATININE URINE RATIO
Creatinine, Urine: 149.8 mg/dL
Microalb/Creat Ratio: 5 mg/g creat (ref 0–29)
Microalbumin, Urine: 7.7 ug/mL

## 2021-12-06 MED ORDER — GLIMEPIRIDE 4 MG PO TABS: 4.0000 mg | ORAL_TABLET | Freq: Every day | ORAL | 1 refills | Status: DC

## 2021-12-11 ENCOUNTER — Encounter: Payer: Self-pay | Admitting: Nurse Practitioner

## 2021-12-11 ENCOUNTER — Ambulatory Visit (INDEPENDENT_AMBULATORY_CARE_PROVIDER_SITE_OTHER): Payer: Commercial Managed Care - PPO | Admitting: Nurse Practitioner

## 2021-12-11 VITALS — BP 130/78 | HR 78 | Temp 97.9°F | Resp 20 | Ht 69.0 in | Wt 182.0 lb

## 2021-12-11 DIAGNOSIS — L720 Epidermal cyst: Secondary | ICD-10-CM

## 2021-12-11 MED ORDER — SULFAMETHOXAZOLE-TRIMETHOPRIM 800-160 MG PO TABS: 1.0000 | ORAL_TABLET | Freq: Two times a day (BID) | ORAL | 0 refills | Status: DC

## 2021-12-11 NOTE — Progress Notes (Signed)
Subjective:    Patient ID: Zachary Estrada, male    DOB: [birth year], 61 y.o.   MRN: [id number]   Chief Complaint: ? cyst on right shoulder blade   HPI Patient comes in c/o cyst on his back. Has been there for about 1 week- sore to touch with slight drainage.    Review of Systems  Constitutional:  Negative for diaphoresis.  Eyes:  Negative for pain.  Respiratory:  Negative for shortness of breath.   Cardiovascular:  Negative for chest pain, palpitations and leg swelling.  Gastrointestinal:  Negative for abdominal pain.  Endocrine: Negative for polydipsia.  Skin:  Negative for rash.  Neurological:  Negative for dizziness, weakness and headaches.  Hematological:  Does not bruise/bleed easily.  All other systems reviewed and are negative.     Objective:   Physical Exam Skin:    General: Skin is warm.     Comments: 3cm erythematous lesion with fluid on right scapula area  Neurological:     General: No focal deficit present.     Mental Status: He is alert and oriented to person, place, and time.  Psychiatric:        Mood and Affect: Mood normal.        Behavior: Behavior normal.   I & D  Date/Time: 12/11/2021 3:19 PM Performed by: Bennie Pierini, FNP Authorized by: Daphine Deutscher Mary-Margaret, FNP   Consent:    Consent obtained:  Verbal   Consent given by:  Patient   Risks, benefits, and alternatives were discussed: yes     Risks discussed:  Incomplete drainage Location:    Type:  Cyst   Location:  Trunk   Trunk location:  Back Pre-procedure details:    Skin preparation:  Povidone-iodine Sedation:    Sedation type:  None Anesthesia:    Anesthesia method:  Local infiltration   Local anesthetic:  Lidocaine 2% WITH epi Procedure type:    Complexity:  Simple Procedure details:    Needle aspiration: no     Incision types:  Stab incision   Scalpel blade:  11   Wound management:  Probed and deloculated   Drainage amount:  Scant   Wound treatment:  Wound left  open   Packing materials:  None Post-procedure details:    Procedure completion:  Tolerated well, no immediate complications   BP 130/78    Pulse 78    Temp 97.9 F (36.6 C) (Temporal)    Resp 20    Ht 5\' 9"  (1.753 m)    Wt 182 lb (82.6 kg)    SpO2 98%    BMI 26.88 kg/m       Assessment & Plan:  in today with chief complaint of ? cyst on right shoulder blade   1. Epidermoid cyst of skin of back Keep clean and dry Do not pick at area Leave dressing on until tomorrow morning Rto prn if not improving  Meds ordered this encounter  Medications   sulfamethoxazole-trimethoprim (BACTRIM DS) 800-160 MG tablet    Sig: Take 1 tablet by mouth 2 (two) times daily.    Dispense:  20 tablet    Refill:  0    Order Specific Question:   Supervising Provider    Answer:   Burt Ek A [[id number]]    The above assessment and management plan was discussed with the patient. The patient verbalized understanding of and has agreed to the management plan. Patient is aware to call the clinic if symptoms persist  or worsen. Patient is aware when to return to the clinic for a follow-up visit. Patient educated on when it is appropriate to go to the emergency department.   Mary-Margaret Daphine Deutscher, FNP

## 2022-03-05 ENCOUNTER — Ambulatory Visit (INDEPENDENT_AMBULATORY_CARE_PROVIDER_SITE_OTHER): Payer: Commercial Managed Care - PPO | Admitting: Family Medicine

## 2022-03-05 ENCOUNTER — Encounter: Payer: Self-pay | Admitting: Family Medicine

## 2022-03-05 VITALS — BP 126/73 | HR 91 | Temp 98.6°F | Resp 16 | Ht 69.0 in | Wt 177.4 lb

## 2022-03-05 DIAGNOSIS — I152 Hypertension secondary to endocrine disorders: Secondary | ICD-10-CM

## 2022-03-05 DIAGNOSIS — E785 Hyperlipidemia, unspecified: Secondary | ICD-10-CM

## 2022-03-05 DIAGNOSIS — J301 Allergic rhinitis due to pollen: Secondary | ICD-10-CM | POA: Diagnosis not present

## 2022-03-05 DIAGNOSIS — E1169 Type 2 diabetes mellitus with other specified complication: Secondary | ICD-10-CM

## 2022-03-05 DIAGNOSIS — E1159 Type 2 diabetes mellitus with other circulatory complications: Secondary | ICD-10-CM

## 2022-03-05 LAB — BAYER DCA HB A1C WAIVED: HB A1C (BAYER DCA - WAIVED): 6.2 % — ABNORMAL HIGH (ref 4.8–5.6)

## 2022-03-05 MED ORDER — GLUCOSE BLOOD VI STRP: ORAL_STRIP | 12 refills | Status: DC

## 2022-03-05 MED ORDER — FLUTICASONE PROPIONATE 50 MCG/ACT NA SUSP: 1.0000 | Freq: Every day | NASAL | 5 refills | Status: DC

## 2022-03-05 NOTE — Progress Notes (Signed)
?  ? ?Subjective:  ?Patient ID: Zachary Estrada, male    DOB: May 29, 1961, 61 y.o.   MRN: [id number] ? ?Patient Care Team: ?Baruch Gouty, FNP as PCP - General (Family Medicine)  ? ?Chief Complaint:  Medical Management of Chronic Issues ? ? ?HPI: ?Zachary Estrada is a 61 y.o. male presenting on 03/05/2022 for Medical Management of Chronic Issues ? ? ?1. Type 2 diabetes mellitus with other specified complication, without long-term current use of insulin ([place]) ?Has mad significant lifestyle changes with addition of increasing amaryl to 4 mg after last visit due to A1C of 10.4. He works out 4 days per week and has cut out all simple carbs in diet. He has increased his lean protein intake. Blood sugar logs reveal lowest reading of 62, highest of 214, average in mid 100 range. He denies polyuria, polyphagia, or polydipsia. He is due for eye exam and will schedule with Dr. Sabra Heck.  ? ?2. Hypertension associated with type 2 diabetes mellitus ([place]) ?Well controlled on current regimen. Has cut sodium intake. No headaches, chest pain, leg swelling, visual changes, weakness, confusion, or syncope.  ? ?3. Hyperlipidemia associated with type 2 diabetes mellitus ([place]) ?On statin therapy without associated side effects. Has made significant lifestyle changes. No myalgias or arthralgias.  ?  ? ? ?Relevant past medical, surgical, family, and social history reviewed and updated as indicated.  ?Allergies and medications reviewed and updated. Data reviewed: Chart in Epic. ? ? ?Past Medical History:  ?Diagnosis Date  ? Allergy   ? Coronary artery disease   ? Diabetes mellitus without complication ([place])   ? GERD (gastroesophageal reflux disease)   ? Hypertension   ? Pneumothorax, acute   ? Raynaud disease   ? ? ?Past Surgical History:  ?Procedure Laterality Date  ? BACK SURGERY    ? PLEURAL SCARIFICATION Left   ? ? ?Social History  ? ?Socioeconomic History  ? Marital status: Single  ?  Spouse name: Not on file  ? Number of children: Not on  file  ? Years of education: Not on file  ? Highest education level: Not on file  ?Occupational History  ? Not on file  ?Tobacco Use  ? Smoking status: Never  ? Smokeless tobacco: Never  ?Substance and Sexual Activity  ? Alcohol use: Yes  ? Drug use: Never  ? Sexual activity: Yes  ?Other Topics Concern  ? Not on file  ?Social History Narrative  ? Not on file  ? ?Social Determinants of Health  ? ?Financial Resource Strain: Not on file  ?Food Insecurity: Not on file  ?Transportation Needs: Not on file  ?Physical Activity: Not on file  ?Stress: Not on file  ?Social Connections: Not on file  ?Intimate Partner Violence: Not on file  ? ? ?Outpatient Encounter Medications as of 03/05/2022  ?Medication Sig  ? atorvastatin (LIPITOR) 20 MG tablet Take 10 mg by mouth daily.  ? fish oil-omega-3 fatty acids 1000 MG capsule Take 1 g by mouth daily.  ? fluticasone (FLONASE) 50 MCG/ACT nasal spray Place 1 spray into both nostrils daily.  ? glimepiride (AMARYL) 4 MG tablet Take 1 tablet (4 mg total) by mouth daily with breakfast.  ? glucose blood test strip One Touch Ultra strips. Check blood sugar 4 times daily  ? METFORMIN HCL ER PO Take 1,000 mg by mouth 2 (two) times daily.  ? Multiple Vitamins-Minerals (ONE-A-DAY MENS 50+ ADVANTAGE PO) Take 1 tablet by mouth daily.  ? triamterene-hydrochlorothiazide (MAXZIDE-25) 37.5-25  MG per tablet Take 0.5 tablets by mouth daily.   ? [DISCONTINUED] potassium chloride (K-DUR,KLOR-CON) 10 MEQ tablet Take 10 mEq by mouth daily.   ? [DISCONTINUED] sulfamethoxazole-trimethoprim (BACTRIM DS) 800-160 MG tablet Take 1 tablet by mouth 2 (two) times daily.  ? ?No facility-administered encounter medications on file as of 03/05/2022.  ? ? ?Allergies  ?Allergen Reactions  ? Cephalexin Other (See Comments)  ? Doxycycline Other (See Comments)  ? Invokamet [Canagliflozin-Metformin Hcl]   ? ? ?Review of Systems  ?Constitutional:  Negative for activity change, appetite change, chills, diaphoresis, fatigue, fever  and unexpected weight change.  ?HENT: Negative.    ?Eyes: Negative.  Negative for photophobia.  ?Respiratory:  Negative for cough, chest tightness and shortness of breath.   ?Cardiovascular:  Negative for chest pain, palpitations and leg swelling.  ?Gastrointestinal:  Negative for abdominal pain, blood in stool, constipation, diarrhea, nausea and vomiting.  ?Endocrine: Negative.  Negative for polydipsia, polyphagia and polyuria.  ?Genitourinary:  Negative for decreased urine volume, difficulty urinating, dysuria, frequency and urgency.  ?Musculoskeletal:  Negative for arthralgias and myalgias.  ?Skin: Negative.   ?Allergic/Immunologic: Negative.   ?Neurological:  Negative for dizziness, tremors, seizures, syncope, facial asymmetry, speech difficulty, weakness, light-headedness, numbness and headaches.  ?Hematological: Negative.   ?Psychiatric/Behavioral:  Negative for confusion, hallucinations, sleep disturbance and suicidal ideas.   ?All other systems reviewed and are negative. ? ?   ? ?Objective:  ?BP 126/73   Pulse 91   Temp 98.6 ?F (37 ?C)   Resp 16   Ht 5\' 9"  (1.753 m)   Wt 177 lb 6.4 oz (80.5 kg)   SpO2 97%   BMI 26.20 kg/m?   ? ?Wt Readings from Last 3 Encounters:  ?03/05/22 177 lb 6.4 oz (80.5 kg)  ?12/11/21 182 lb (82.6 kg)  ?12/04/21 183 lb 9.6 oz (83.3 kg)  ? ? ?Physical Exam ?Vitals and nursing note reviewed.  ?Constitutional:   ?   General: He is not in acute distress. ?   Appearance: Normal appearance. He is well-developed and well-groomed. He is not ill-appearing, toxic-appearing or diaphoretic.  ?HENT:  ?   Head: Normocephalic and atraumatic.  ?   Jaw: There is normal jaw occlusion.  ?   Right Ear: Hearing normal.  ?   Left Ear: Hearing normal.  ?   Nose: Nose normal.  ?   Mouth/Throat:  ?   Lips: Pink.  ?   Mouth: Mucous membranes are moist.  ?   Pharynx: Oropharynx is clear. Uvula midline.  ?Eyes:  ?   General: Lids are normal.  ?   Extraocular Movements: Extraocular movements intact.  ?    Conjunctiva/sclera: Conjunctivae normal.  ?   Pupils: Pupils are equal, round, and reactive to light.  ?Neck:  ?   Thyroid: No thyroid mass, thyromegaly or thyroid tenderness.  ?   Vascular: No carotid bruit or JVD.  ?   Trachea: Trachea and phonation normal.  ?Cardiovascular:  ?   Rate and Rhythm: Normal rate and regular rhythm.  ?   Chest Wall: PMI is not displaced.  ?   Pulses: Normal pulses.  ?   Heart sounds: Normal heart sounds. No murmur heard. ?  No friction rub. No gallop.  ?Pulmonary:  ?   Effort: Pulmonary effort is normal.  ?   Breath sounds: Normal breath sounds.  ?Abdominal:  ?   General: There is no abdominal bruit.  ?   Palpations: There is no hepatomegaly or splenomegaly.  ?  Musculoskeletal:     ?   General: Normal range of motion.  ?   Cervical back: Normal range of motion and neck supple.  ?   Right lower leg: No edema.  ?   Left lower leg: No edema.  ?Lymphadenopathy:  ?   Cervical: No cervical adenopathy.  ?Skin: ?   General: Skin is warm and dry.  ?   Capillary Refill: Capillary refill takes less than 2 seconds.  ?   Coloration: Skin is not cyanotic, jaundiced or pale.  ?   Findings: No rash.  ?Neurological:  ?   General: No focal deficit present.  ?   Mental Status: He is alert and oriented to person, place, and time.  ?   Sensory: Sensation is intact.  ?   Motor: Motor function is intact.  ?   Coordination: Coordination is intact.  ?   Gait: Gait is intact.  ?   Deep Tendon Reflexes: Reflexes are normal and symmetric.  ?Psychiatric:     ?   Attention and Perception: Attention and perception normal.     ?   Mood and Affect: Mood and affect normal.     ?   Speech: Speech normal.     ?   Behavior: Behavior normal. Behavior is cooperative.     ?   Thought Content: Thought content normal.     ?   Cognition and Memory: Cognition and memory normal.     ?   Judgment: Judgment normal.  ? ? ?Results for orders placed or performed in visit on 12/04/21  ?CBC with Differential/Platelet  ?Result Value Ref  Range  ? WBC 9.2 3.4 - 10.8 x10E3/uL  ? RBC 5.41 4.14 - 5.80 x10E6/uL  ? Hemoglobin 17.4 13.0 - 17.7 g/dL  ? Hematocrit 49.8 37.5 - 51.0 %  ? MCV 92 79 - 97 fL  ? MCH 32.2 26.6 - 33.0 pg  ? MCHC 34.9 31.5 - 35.

## 2022-03-05 NOTE — Patient Instructions (Addendum)

## 2022-03-06 LAB — LIPID PANEL
Chol/HDL Ratio: 4.8 ratio (ref 0.0–5.0)
Cholesterol, Total: 152 mg/dL (ref 100–199)
HDL: 32 mg/dL — ABNORMAL LOW (ref >39)
LDL Chol Calc (NIH): 93 mg/dL (ref 0–99)
Triglycerides: 155 mg/dL — ABNORMAL HIGH (ref 0–149)
VLDL Cholesterol Cal: 27 mg/dL (ref 5–40)

## 2022-03-06 LAB — CBC WITH DIFFERENTIAL/PLATELET
Basophils Absolute: 0.1 10*3/uL (ref 0.0–0.2)
Basos: 2 %
EOS (ABSOLUTE): 0.3 10*3/uL (ref 0.0–0.4)
Eos: 4 %
Hematocrit: 49.5 % (ref 37.5–51.0)
Hemoglobin: 16.9 g/dL (ref 13.0–17.7)
Immature Grans (Abs): 0 10*3/uL (ref 0.0–0.1)
Immature Granulocytes: 0 %
Lymphocytes Absolute: 1.4 10*3/uL (ref 0.7–3.1)
Lymphs: 20 %
MCH: 31.5 pg (ref 26.6–33.0)
MCHC: 34.1 g/dL (ref 31.5–35.7)
MCV: 92 fL (ref 79–97)
Monocytes Absolute: 0.7 10*3/uL (ref 0.1–0.9)
Monocytes: 9 %
Neutrophils Absolute: 4.6 10*3/uL (ref 1.4–7.0)
Neutrophils: 65 %
Platelets: 355 10*3/uL (ref 150–450)
RBC: 5.37 x10E6/uL (ref 4.14–5.80)
RDW: 11.6 % (ref 11.6–15.4)
WBC: 7 10*3/uL (ref 3.4–10.8)

## 2022-03-06 LAB — CMP14+EGFR
ALT: 16 IU/L (ref 0–44)
AST: 14 IU/L (ref 0–40)
Albumin/Globulin Ratio: 2.3 — ABNORMAL HIGH (ref 1.2–2.2)
Albumin: 4.6 g/dL (ref 3.8–4.9)
Alkaline Phosphatase: 67 IU/L (ref 44–121)
BUN/Creatinine Ratio: 13 (ref 9–20)
BUN: 13 mg/dL (ref 6–24)
Bilirubin Total: 0.4 mg/dL (ref 0.0–1.2)
CO2: 26 mmol/L (ref 20–29)
Calcium: 9.3 mg/dL (ref 8.7–10.2)
Chloride: 98 mmol/L (ref 96–106)
Creatinine, Ser: 1 mg/dL (ref 0.76–1.27)
Globulin, Total: 2 g/dL (ref 1.5–4.5)
Glucose: 288 mg/dL — ABNORMAL HIGH (ref 70–99)
Potassium: 4.4 mmol/L (ref 3.5–5.2)
Sodium: 138 mmol/L (ref 134–144)
Total Protein: 6.6 g/dL (ref 6.0–8.5)
eGFR: 87 mL/min/{1.73_m2} (ref >59)

## 2022-04-10 ENCOUNTER — Encounter: Payer: Self-pay | Admitting: Family Medicine

## 2022-04-24 ENCOUNTER — Encounter: Payer: Self-pay | Admitting: Family Medicine

## 2022-05-14 LAB — HM DIABETES EYE EXAM

## 2022-09-07 ENCOUNTER — Encounter: Payer: Self-pay | Admitting: Family Medicine

## 2022-09-07 ENCOUNTER — Other Ambulatory Visit: Payer: Self-pay | Admitting: Family Medicine

## 2022-09-09 ENCOUNTER — Other Ambulatory Visit: Payer: Self-pay | Admitting: *Deleted

## 2022-09-09 MED ORDER — TRIAMTERENE-HCTZ 37.5-25 MG PO TABS: 0.5000 | ORAL_TABLET | Freq: Every day | ORAL | 3 refills | Status: DC

## 2022-12-13 ENCOUNTER — Ambulatory Visit (INDEPENDENT_AMBULATORY_CARE_PROVIDER_SITE_OTHER): Payer: Commercial Managed Care - PPO | Admitting: Family Medicine

## 2022-12-13 ENCOUNTER — Encounter: Payer: Self-pay | Admitting: Family Medicine

## 2022-12-13 VITALS — BP 130/83 | HR 70 | Temp 97.5°F | Ht 69.0 in | Wt 183.6 lb

## 2022-12-13 DIAGNOSIS — E1169 Type 2 diabetes mellitus with other specified complication: Secondary | ICD-10-CM

## 2022-12-13 DIAGNOSIS — E1159 Type 2 diabetes mellitus with other circulatory complications: Secondary | ICD-10-CM

## 2022-12-13 DIAGNOSIS — E785 Hyperlipidemia, unspecified: Secondary | ICD-10-CM

## 2022-12-13 DIAGNOSIS — I152 Hypertension secondary to endocrine disorders: Secondary | ICD-10-CM

## 2022-12-13 DIAGNOSIS — M25511 Pain in right shoulder: Secondary | ICD-10-CM

## 2022-12-13 LAB — BAYER DCA HB A1C WAIVED: HB A1C (BAYER DCA - WAIVED): 8.1 % — ABNORMAL HIGH (ref 4.8–5.6)

## 2022-12-13 MED ORDER — PREDNISONE 20 MG PO TABS: 40.0000 mg | ORAL_TABLET | Freq: Every day | ORAL | 0 refills | Status: AC

## 2022-12-13 MED ORDER — GLIMEPIRIDE 4 MG PO TABS: 8.0000 mg | ORAL_TABLET | Freq: Every day | ORAL | 3 refills | Status: DC

## 2022-12-13 NOTE — Progress Notes (Signed)
Subjective:  Patient ID: Zachary Estrada, male    DOB: 08/01/1961, 62 y.o.   MRN: [id number]  Patient Care Team: Sonny Masters, FNP as PCP - General (Family Medicine)   Chief Complaint:  Medical Management of Chronic Issues (6 month follow up )   HPI: Zachary Estrada is a 62 y.o. male presenting on 12/13/2022 for Medical Management of Chronic Issues (6 month follow up )   1. Type 2 diabetes mellitus with other specified complication, without long-term current use of insulin ([facility]) Pt has not been in office since 03/2022. He had been taking metformin and amaryl for diabetes management. Reports blood sugars averaging in the high 100s, range over last several months 78-281. He does report polyuria. Denies polydipsia or polyphagia. Has has been in the gym working out 4-5 days per week and has been trying to follow a healthy diet. His last A1C was great. He does not wish to continue metformin and would eventually get off of medications all together.   2. Hypertension associated with type 2 diabetes mellitus ([facility]) On Maxzide and tolerating well. He denies headaches, chest pain, leg swelling, weakness, confusion, or visual changes. Does follow a healthy diet and exercises 4-5 days per week.   3. Hyperlipidemia associated with type 2 diabetes mellitus ([facility]) On statin therapy and tolerating well. Denies myalgias.   4. Acute pain of right shoulder Pt states he has been lifting heavy at the gym and has developed posterior right shoulder pain. The pain is shooting to sharp in nature. Worse with palpation and rotation of head. No loss of function. Has not tried anything for symptoms.      Relevant past medical, surgical, family, and social history reviewed and updated as indicated.  Allergies and medications reviewed and updated. Data reviewed: Chart in Epic.   Past Medical History:  Diagnosis Date   Allergy    Coronary artery disease    Diabetes mellitus without complication (H[facility])    GERD  (gastroesophageal reflux disease)    Hypertension    Pneumothorax, acute    Raynaud disease     Past Surgical History:  Procedure Laterality Date   BACK SURGERY     PLEURAL SCARIFICATION Left     Social History   Socioeconomic History   Marital status: Single    Spouse name: Not on file   Number of children: Not on file   Years of education: Not on file   Highest education level: Not on file  Occupational History   Not on file  Tobacco Use   Smoking status: Never   Smokeless tobacco: Never  Substance and Sexual Activity   Alcohol use: Yes   Drug use: Never   Sexual activity: Yes  Other Topics Concern   Not on file  Social History Narrative   Not on file   Social Determinants of Health   Financial Resource Strain: Not on file  Food Insecurity: Not on file  Transportation Needs: Not on file  Physical Activity: Not on file  Stress: Not on file  Social Connections: Not on file  Intimate Partner Violence: Not on file    Outpatient Encounter Medications as of 12/13/2022  Medication Sig   aspirin EC 81 MG tablet Take 81 mg by mouth daily. Swallow whole.   atorvastatin (LIPITOR) 20 MG tablet Take 10 mg by mouth daily.   fish oil-omega-3 fatty acids 1000 MG capsule Take 1 g by mouth daily.   fluticasone (FLONASE) 50 MCG/ACT  nasal spray Place 1 spray into both nostrils daily.   glimepiride (AMARYL) 4 MG tablet Take 2 tablets (8 mg total) by mouth daily with breakfast.   glucose blood test strip One Touch Ultra strips. Check blood sugar 4 times daily   Multiple Vitamins-Minerals (ONE-A-DAY MENS 50+ ADVANTAGE PO) Take 1 tablet by mouth daily.   predniSONE (DELTASONE) 20 MG tablet Take 2 tablets (40 mg total) by mouth daily with breakfast for 5 days.   triamterene-hydrochlorothiazide (MAXZIDE-25) 37.5-25 MG tablet Take 0.5 tablets by mouth daily.   [DISCONTINUED] glimepiride (AMARYL) 4 MG tablet Take 1 tablet (4 mg total) by mouth daily with breakfast.   [DISCONTINUED]  METFORMIN HCL ER PO Take 1,000 mg by mouth 2 (two) times daily.   No facility-administered encounter medications on file as of 12/13/2022.    Allergies  Allergen Reactions   Cephalexin Other (See Comments)   Doxycycline Other (See Comments)   Invokamet [Canagliflozin-Metformin Hcl]     Review of Systems  Constitutional:  Negative for activity change, appetite change, chills, diaphoresis, fatigue, fever and unexpected weight change.  HENT: Negative.    Eyes: Negative.  Negative for photophobia and visual disturbance.  Respiratory:  Negative for cough, chest tightness and shortness of breath.   Cardiovascular:  Negative for chest pain, palpitations and leg swelling.  Gastrointestinal:  Negative for abdominal pain, blood in stool, constipation, diarrhea, nausea and vomiting.  Endocrine: Positive for polyuria.  Genitourinary:  Negative for difficulty urinating, dysuria, frequency and urgency.  Musculoskeletal:  Positive for arthralgias and myalgias. Negative for back pain, gait problem, joint swelling, neck pain and neck stiffness.  Skin: Negative.   Allergic/Immunologic: Negative.   Neurological:  Negative for dizziness, tremors, seizures, syncope, facial asymmetry, speech difficulty, weakness, light-headedness, numbness and headaches.  Hematological: Negative.   Psychiatric/Behavioral:  Negative for confusion, hallucinations, sleep disturbance and suicidal ideas.   All other systems reviewed and are negative.       Objective:  BP 130/83   Pulse 70   Temp (!) 97.5 F (36.4 C) (Temporal)   Ht 5\' 9"  (1.753 m)   Wt 183 lb 9.6 oz (83.3 kg)   SpO2 99%   BMI 27.11 kg/m    Wt Readings from Last 3 Encounters:  12/13/22 183 lb 9.6 oz (83.3 kg)  03/05/22 177 lb 6.4 oz (80.5 kg)  12/11/21 182 lb (82.6 kg)    Physical Exam Vitals and nursing note reviewed.  Constitutional:      General: He is not in acute distress.    Appearance: Normal appearance. He is well-developed,  well-groomed and overweight. He is not ill-appearing, toxic-appearing or diaphoretic.  HENT:     Head: Normocephalic and atraumatic.     Jaw: There is normal jaw occlusion.     Right Ear: Hearing normal.     Left Ear: Hearing normal.     Nose: Nose normal.     Mouth/Throat:     Lips: Pink.     Mouth: Mucous membranes are moist.     Pharynx: Oropharynx is clear. Uvula midline.  Eyes:     General: Lids are normal.     Extraocular Movements: Extraocular movements intact.     Conjunctiva/sclera: Conjunctivae normal.     Pupils: Pupils are equal, round, and reactive to light.  Neck:     Thyroid: No thyroid mass, thyromegaly or thyroid tenderness.     Vascular: No carotid bruit or JVD.     Trachea: Trachea and phonation normal.  Cardiovascular:  Rate and Rhythm: Normal rate and regular rhythm.     Chest Wall: PMI is not displaced.     Pulses: Normal pulses.     Heart sounds: Normal heart sounds. No murmur heard.    No friction rub. No gallop.  Pulmonary:     Effort: Pulmonary effort is normal. No respiratory distress.     Breath sounds: Normal breath sounds. No wheezing.  Abdominal:     General: Bowel sounds are normal. There is no abdominal bruit.     Palpations: Abdomen is soft. There is no hepatomegaly or splenomegaly.  Musculoskeletal:        General: Normal range of motion.     Right shoulder: Tenderness present. No swelling, deformity, effusion, laceration, bony tenderness or crepitus. Normal range of motion. Normal strength. Normal pulse.     Left shoulder: Normal.     Right upper arm: Normal.       Arms:     Cervical back: Normal, normal range of motion and neck supple.     Right lower leg: No edema.     Left lower leg: No edema.  Lymphadenopathy:     Cervical: No cervical adenopathy.  Skin:    General: Skin is warm and dry.     Capillary Refill: Capillary refill takes less than 2 seconds.     Coloration: Skin is not cyanotic, jaundiced or pale.     Findings: No  rash.  Neurological:     General: No focal deficit present.     Mental Status: He is alert and oriented to person, place, and time.     Sensory: Sensation is intact.     Motor: Motor function is intact.     Coordination: Coordination is intact.     Gait: Gait is intact.     Deep Tendon Reflexes: Reflexes are normal and symmetric.  Psychiatric:        Attention and Perception: Attention and perception normal.        Mood and Affect: Mood and affect normal.        Speech: Speech normal.        Behavior: Behavior normal. Behavior is cooperative.        Thought Content: Thought content normal.        Cognition and Memory: Cognition and memory normal.        Judgment: Judgment normal.     Results for orders placed or performed in visit on 12/13/22  Bayer DCA Hb A1c Waived  Result Value Ref Range   HB A1C (BAYER DCA - WAIVED) 8.1 (H) 4.8 - 5.6 %       Pertinent labs & imaging results that were available during my care of the patient were reviewed by me and considered in my medical decision making.  Assessment & Plan:  Lanell was seen today for medical management of chronic issues.  Diagnoses and all orders for this visit:  Type 2 diabetes mellitus with other specified complication, without long-term current use of insulin ([place]) Not controlled, A1C 8.1 today. Pt would like to stop metformin. Will increase glimepiride to 8 mg daily and stop metformin. Pt aware he must follow up in office in 3 months for reevaluation. Other labs pending. Diet and exercise discussed.  -     Lipid panel -     CBC with Differential/Platelet -     CMP14+EGFR -     Thyroid Panel With TSH -     Bayer DCA Hb A1c Waived -  Microalbumin / creatinine urine ratio -     glimepiride (AMARYL) 4 MG tablet; Take 2 tablets (8 mg total) by mouth daily with breakfast.  Hypertension associated with type 2 diabetes mellitus (HCC) BP well controlled. Changes were not made in regimen today. Goal BP is 130/80. Pt  aware to report any persistent high or low readings. DASH diet and exercise encouraged. Exercise at least 150 minutes per week and increase as tolerated. Goal BMI > 25. Stress management encouraged. Avoid nicotine and tobacco product use. Avoid excessive alcohol and NSAID's. Avoid more than 2000 mg of sodium daily. Medications as prescribed. Follow up as scheduled.  -     Lipid panel -     CBC with Differential/Platelet -     CMP14+EGFR -     Thyroid Panel With TSH -     Microalbumin / creatinine urine ratio  Hyperlipidemia associated with type 2 diabetes mellitus (HCC) Diet encouraged - increase intake of fresh fruits and vegetables, increase intake of lean proteins. Bake, broil, or grill foods. Avoid fried, greasy, and fatty foods. Avoid fast foods. Increase intake of fiber-rich whole grains. Exercise encouraged - at least 150 minutes per week and advance as tolerated. Goal BMI < 25. Continue medications as prescribed. Follow up in 3-6 months as discussed.  -     Lipid panel -     CMP14+EGFR  Acute pain of right shoulder Spasm of trap noted. Will burst with short course of steroids. Pt aware to monitor BS more frequently. Symptomatic care discussed in detail. Report new, worsening, or persistent symptoms.  -     predniSONE (DELTASONE) 20 MG tablet; Take 2 tablets (40 mg total) by mouth daily with breakfast for 5 days.     Continue all other maintenance medications.  Follow up plan: Return in about 3 months (around 03/14/2023), or if symptoms worsen or fail to improve, for DM.   Continue healthy lifestyle choices, including diet (rich in fruits, vegetables, and lean proteins, and low in salt and simple carbohydrates) and exercise (at least 30 minutes of moderate physical activity daily).  Educational handout given for DM  The above assessment and management plan was discussed with the patient. The patient verbalized understanding of and has agreed to the management plan. Patient is aware  to call the clinic if they develop any new symptoms or if symptoms persist or worsen. Patient is aware when to return to the clinic for a follow-up visit. Patient educated on when it is appropriate to go to the emergency department.   Kari Baars, FNP-C [facility] [phone]

## 2022-12-13 NOTE — Patient Instructions (Addendum)

## 2022-12-14 LAB — CMP14+EGFR
ALT: 19 IU/L (ref 0–44)
AST: 14 IU/L (ref 0–40)
Albumin/Globulin Ratio: 2.2 (ref 1.2–2.2)
Albumin: 4.7 g/dL (ref 3.8–4.9)
Alkaline Phosphatase: 57 IU/L (ref 44–121)
BUN/Creatinine Ratio: 13 (ref 9–20)
BUN: 14 mg/dL (ref 6–24)
Bilirubin Total: 0.5 mg/dL (ref 0.0–1.2)
CO2: 25 mmol/L (ref 20–29)
Calcium: 9.1 mg/dL (ref 8.7–10.2)
Chloride: 100 mmol/L (ref 96–106)
Creatinine, Ser: 1.05 mg/dL (ref 0.76–1.27)
Globulin, Total: 2.1 g/dL (ref 1.5–4.5)
Glucose: 109 mg/dL — ABNORMAL HIGH (ref 70–99)
Potassium: 3.9 mmol/L (ref 3.5–5.2)
Sodium: 140 mmol/L (ref 134–144)
Total Protein: 6.8 g/dL (ref 6.0–8.5)
eGFR: 82 mL/min/{1.73_m2} (ref >59)

## 2022-12-14 LAB — CBC WITH DIFFERENTIAL/PLATELET
Basophils Absolute: 0.1 10*3/uL (ref 0.0–0.2)
Basos: 1 %
EOS (ABSOLUTE): 0.3 10*3/uL (ref 0.0–0.4)
Eos: 4 %
Hematocrit: 45.2 % (ref 37.5–51.0)
Hemoglobin: 16.1 g/dL (ref 13.0–17.7)
Immature Grans (Abs): 0 10*3/uL (ref 0.0–0.1)
Immature Granulocytes: 0 %
Lymphocytes Absolute: 2.1 10*3/uL (ref 0.7–3.1)
Lymphs: 30 %
MCH: 32.3 pg (ref 26.6–33.0)
MCHC: 35.6 g/dL (ref 31.5–35.7)
MCV: 91 fL (ref 79–97)
Monocytes Absolute: 0.7 10*3/uL (ref 0.1–0.9)
Monocytes: 10 %
Neutrophils Absolute: 3.8 10*3/uL (ref 1.4–7.0)
Neutrophils: 55 %
Platelets: 291 10*3/uL (ref 150–450)
RBC: 4.98 x10E6/uL (ref 4.14–5.80)
RDW: 11.6 % (ref 11.6–15.4)
WBC: 7.1 10*3/uL (ref 3.4–10.8)

## 2022-12-14 LAB — THYROID PANEL WITH TSH
Free Thyroxine Index: 2.2 (ref 1.2–4.9)
T3 Uptake Ratio: 28 % (ref 24–39)
T4, Total: 7.9 ug/dL (ref 4.5–12.0)
TSH: 1.43 u[IU]/mL (ref 0.450–4.500)

## 2022-12-14 LAB — LIPID PANEL
Chol/HDL Ratio: 4.1 ratio (ref 0.0–5.0)
Cholesterol, Total: 140 mg/dL (ref 100–199)
HDL: 34 mg/dL — ABNORMAL LOW (ref >39)
LDL Chol Calc (NIH): 88 mg/dL (ref 0–99)
Triglycerides: 95 mg/dL (ref 0–149)
VLDL Cholesterol Cal: 18 mg/dL (ref 5–40)

## 2022-12-14 LAB — MICROALBUMIN / CREATININE URINE RATIO
Creatinine, Urine: 127.6 mg/dL
Microalb/Creat Ratio: 4 mg/g creat (ref 0–29)
Microalbumin, Urine: 4.6 ug/mL

## 2023-02-02 ENCOUNTER — Encounter: Payer: Self-pay | Admitting: Family Medicine

## 2023-02-03 ENCOUNTER — Encounter: Payer: Self-pay | Admitting: Family Medicine

## 2023-02-06 ENCOUNTER — Encounter: Payer: Self-pay | Admitting: Family Medicine

## 2023-02-06 DIAGNOSIS — E1169 Type 2 diabetes mellitus with other specified complication: Secondary | ICD-10-CM

## 2023-02-07 MED ORDER — METFORMIN HCL ER 500 MG PO TB24: 500.0000 mg | ORAL_TABLET | Freq: Every day | ORAL | 0 refills | Status: DC

## 2023-03-18 ENCOUNTER — Ambulatory Visit (INDEPENDENT_AMBULATORY_CARE_PROVIDER_SITE_OTHER): Payer: Commercial Managed Care - PPO | Admitting: Family Medicine

## 2023-03-18 ENCOUNTER — Other Ambulatory Visit: Payer: Self-pay | Admitting: Family Medicine

## 2023-03-18 ENCOUNTER — Encounter: Payer: Self-pay | Admitting: Family Medicine

## 2023-03-18 VITALS — BP 137/83 | HR 78 | Temp 98.1°F | Ht 69.0 in | Wt 179.8 lb

## 2023-03-18 DIAGNOSIS — E1169 Type 2 diabetes mellitus with other specified complication: Secondary | ICD-10-CM | POA: Diagnosis not present

## 2023-03-18 DIAGNOSIS — J301 Allergic rhinitis due to pollen: Secondary | ICD-10-CM | POA: Diagnosis not present

## 2023-03-18 LAB — BAYER DCA HB A1C WAIVED: HB A1C (BAYER DCA - WAIVED): 9 % — ABNORMAL HIGH (ref 4.8–5.6)

## 2023-03-18 MED ORDER — METFORMIN HCL ER (OSM) 1000 MG PO TB24
1000.0000 mg | ORAL_TABLET | Freq: Every day | ORAL | 3 refills | Status: DC
Start: 2023-03-18 — End: 2023-03-18

## 2023-03-18 MED ORDER — METFORMIN HCL ER (OSM) 1000 MG PO TB24
1000.0000 mg | ORAL_TABLET | Freq: Every day | ORAL | 3 refills | Status: DC
Start: 2023-03-18 — End: 2023-03-19

## 2023-03-18 MED ORDER — LEVOCETIRIZINE DIHYDROCHLORIDE 5 MG PO TABS
5.0000 mg | ORAL_TABLET | Freq: Every evening | ORAL | 1 refills | Status: DC
Start: 2023-03-18 — End: 2023-06-27

## 2023-03-18 MED ORDER — GLIMEPIRIDE 4 MG PO TABS: 8.0000 mg | ORAL_TABLET | Freq: Every day | ORAL | 3 refills | Status: DC

## 2023-03-18 MED ORDER — METFORMIN HCL ER (OSM) 1000 MG PO TB24: 1000.0000 mg | ORAL_TABLET | Freq: Every day | ORAL | 3 refills | Status: DC

## 2023-03-18 NOTE — Patient Instructions (Addendum)

## 2023-03-18 NOTE — Addendum Note (Signed)
Addended by: Sonny Masters on: 03/18/2023 09:57 PM   Modules accepted: Orders

## 2023-03-18 NOTE — Telephone Encounter (Signed)
Name from pharmacy: METFORMIN ER 1,000 MG OSM-TAB        Will file in chart as: metformin (FORTAMET) 1000 MG (OSM) 24 hr tablet   Pharmacy comment: Alternative Requested:DRUG NOT COVERED; PLEASE CONTACT INSURANCE OR CONSIDER ALERNATIVE.

## 2023-03-18 NOTE — Addendum Note (Signed)
Addended by: Sonny Masters on: 03/18/2023 09:56 PM   Modules accepted: Orders

## 2023-03-18 NOTE — Progress Notes (Signed)
Subjective:  Patient ID: Zachary Estrada, male    DOB: [birth year], 62 y.o.   MRN: [id number]  Patient Care Team: Sonny Masters, FNP as PCP - General (Family Medicine)   Chief Complaint:  Diabetes (3 month follow up )   HPI: Zachary Estrada is a 62 y.o. male presenting on 03/18/2023 for Diabetes (3 month follow up )   Diabetes He presents for his follow-up diabetic visit. He has type 2 diabetes mellitus. Pertinent negatives for hypoglycemia include no confusion, dizziness, headaches, hunger, mood changes, nervousness/anxiousness, pallor, seizures, sleepiness, speech difficulty, sweats or tremors. Pertinent negatives for diabetes include no blurred vision, no chest pain, no fatigue, no foot paresthesias, no foot ulcerations, no polydipsia, no polyphagia, no polyuria, no visual change, no weakness and no weight loss. There are no hypoglycemic complications. Risk factors for coronary artery disease include male sex and diabetes mellitus. Current diabetic treatment includes oral agent (dual therapy). He is compliant with treatment all of the time. He is following a diabetic diet. He participates in exercise daily. His home blood glucose trend is decreasing steadily. An ACE inhibitor/angiotensin II receptor blocker is being taken. Eye exam is current.  Pt also reports nasal congestion and rhinorrhea along with ear fullness and postnasal drainage. Has been using Flonase as needed, not on a regular basis.   Relevant past medical, surgical, family, and social history reviewed and updated as indicated.  Allergies and medications reviewed and updated. Data reviewed: Chart in Epic.   Past Medical History:  Diagnosis Date   Allergy    Coronary artery disease    Diabetes mellitus without complication    GERD (gastroesophageal reflux disease)    Hypertension    Pneumothorax, acute    Raynaud disease     Past Surgical History:  Procedure Laterality Date   BACK SURGERY     PLEURAL  SCARIFICATION Left     Social History   Socioeconomic History   Marital status: Single    Spouse name: Not on file   Number of children: Not on file   Years of education: Not on file   Highest education level: Some college, no degree  Occupational History   Not on file  Tobacco Use   Smoking status: Never   Smokeless tobacco: Never  Substance and Sexual Activity   Alcohol use: Yes   Drug use: Never   Sexual activity: Yes  Other Topics Concern   Not on file  Social History Narrative   Not on file   Social Determinants of Health   Financial Resource Strain: Medium Risk (03/18/2023)   Overall Financial Resource Strain (CARDIA)    Difficulty of Paying Living Expenses: Somewhat hard  Food Insecurity: Food Insecurity Present (03/18/2023)   Hunger Vital Sign    Worried About Running Out of Food in the Last Year: Sometimes true    Ran Out of Food in the Last Year: Sometimes true  Transportation Needs: No Transportation Needs (03/18/2023)   PRAPARE - Administrator, Civil Service (Medical): No    Lack of Transportation (Non-Medical): No  Physical Activity: Sufficiently Active (03/18/2023)   Exercise Vital Sign    Days of Exercise per Week: 5 days    Minutes of Exercise per Session: 150+ min  Stress: Stress Concern Present (03/18/2023)   [employer] of Occupational Health - Occupational Stress Questionnaire    Feeling of Stress : Rather much  Social Connections: Moderately Isolated (03/18/2023)   Social Connection and  Isolation Panel [NHANES]    Frequency of Communication with Friends and Family: More than three times a week    Frequency of Social Gatherings with Friends and Family: More than three times a week    Attends Religious Services: 1 to 4 times per year    Active Member of [employer] or Organizations: No    Attends Banker Meetings: Not on file    Marital Status: Widowed  Intimate Partner Violence: Not on file    Outpatient Encounter  Medications as of 03/18/2023  Medication Sig   aspirin EC 81 MG tablet Take 81 mg by mouth daily. Swallow whole.   atorvastatin (LIPITOR) 20 MG tablet Take 10 mg by mouth daily.   fish oil-omega-3 fatty acids 1000 MG capsule Take 1 g by mouth daily.   fluticasone (FLONASE) 50 MCG/ACT nasal spray Place 1 spray into both nostrils daily.   glucose blood test strip One Touch Ultra strips. Check blood sugar 4 times daily   levocetirizine (XYZAL) 5 MG tablet Take 1 tablet (5 mg total) by mouth every evening.   metformin (FORTAMET) 1000 MG (OSM) 24 hr tablet Take 1 tablet (1,000 mg total) by mouth daily with breakfast.   Multiple Vitamins-Minerals (ONE-A-DAY MENS 50+ ADVANTAGE PO) Take 1 tablet by mouth daily.   triamterene-hydrochlorothiazide (MAXZIDE-25) 37.5-25 MG tablet Take 0.5 tablets by mouth daily.   [DISCONTINUED] metFORMIN (GLUCOPHAGE-XR) 500 MG 24 hr tablet Take 1 tablet (500 mg total) by mouth daily with breakfast.   glimepiride (AMARYL) 4 MG tablet Take 2 tablets (8 mg total) by mouth daily with breakfast.   [DISCONTINUED] glimepiride (AMARYL) 4 MG tablet Take 2 tablets (8 mg total) by mouth daily with breakfast.   No facility-administered encounter medications on file as of 03/18/2023.    Allergies  Allergen Reactions   Cephalexin Other (See Comments)   Doxycycline Other (See Comments)   Invokamet [Canagliflozin-Metformin Hcl]     Review of Systems  Constitutional:  Negative for activity change, appetite change, chills, diaphoresis, fatigue, fever, unexpected weight change and weight loss.  HENT:  Positive for congestion and rhinorrhea.   Eyes: Negative.  Negative for blurred vision, photophobia and visual disturbance.  Respiratory:  Negative for cough, chest tightness and shortness of breath.   Cardiovascular:  Negative for chest pain, palpitations and leg swelling.  Gastrointestinal:  Negative for blood in stool, constipation, diarrhea, nausea and vomiting.  Endocrine:  Negative.  Negative for polydipsia, polyphagia and polyuria.  Genitourinary:  Negative for decreased urine volume, difficulty urinating, dysuria, frequency and urgency.  Musculoskeletal:  Negative for arthralgias and myalgias.  Skin: Negative.  Negative for pallor.  Allergic/Immunologic: Negative.   Neurological:  Negative for dizziness, tremors, seizures, syncope, facial asymmetry, speech difficulty, weakness, light-headedness, numbness and headaches.  Hematological: Negative.   Psychiatric/Behavioral:  Negative for confusion, hallucinations, sleep disturbance and suicidal ideas. The patient is not nervous/anxious.   All other systems reviewed and are negative.       Objective:  BP 137/83   Pulse 78   Temp 98.1 F (36.7 C) (Temporal)   Ht 5\' 9"  (1.753 m)   Wt 179 lb 12.8 oz (81.6 kg)   SpO2 97%   BMI 26.55 kg/m    Wt Readings from Last 3 Encounters:  03/18/23 179 lb 12.8 oz (81.6 kg)  12/13/22 183 lb 9.6 oz (83.3 kg)  03/05/22 177 lb 6.4 oz (80.5 kg)    Physical Exam Vitals and nursing note reviewed.  Constitutional:  General: He is not in acute distress.    Appearance: Normal appearance. He is well-developed and well-groomed. He is not ill-appearing, toxic-appearing or diaphoretic.  HENT:     Head: Normocephalic and atraumatic.     Jaw: There is normal jaw occlusion.     Right Ear: Hearing normal. A middle ear effusion is present. Tympanic membrane is not erythematous.     Left Ear: Hearing normal. A middle ear effusion is present. Tympanic membrane is not erythematous.     Nose: Rhinorrhea present. Rhinorrhea is clear.     Mouth/Throat:     Lips: Pink.     Mouth: Mucous membranes are moist.     Pharynx: Oropharynx is clear. Uvula midline.     Comments: Cobblestoning and postnasal drainge Eyes:     General: Lids are normal.     Conjunctiva/sclera: Conjunctivae normal.     Pupils: Pupils are equal, round, and reactive to light.  Neck:     Thyroid: No thyroid  mass, thyromegaly or thyroid tenderness.     Vascular: No JVD.     Trachea: Trachea and phonation normal.  Cardiovascular:     Rate and Rhythm: Normal rate and regular rhythm.     Chest Wall: PMI is not displaced.     Pulses: Normal pulses.     Heart sounds: Normal heart sounds. No murmur heard.    No friction rub. No gallop.  Pulmonary:     Effort: Pulmonary effort is normal. No respiratory distress.     Breath sounds: Normal breath sounds. No wheezing.  Abdominal:     General: Bowel sounds are normal. There is no abdominal bruit.     Palpations: Abdomen is soft. There is no hepatomegaly or splenomegaly.  Musculoskeletal:        General: Normal range of motion.     Cervical back: Normal range of motion and neck supple.     Right lower leg: No edema.     Left lower leg: No edema.  Skin:    General: Skin is warm and dry.     Capillary Refill: Capillary refill takes less than 2 seconds.     Coloration: Skin is not cyanotic, jaundiced or pale.     Findings: No rash.  Neurological:     General: No focal deficit present.     Mental Status: He is alert and oriented to person, place, and time.     Sensory: Sensation is intact.     Motor: Motor function is intact.     Coordination: Coordination is intact.     Gait: Gait is intact.     Deep Tendon Reflexes: Reflexes are normal and symmetric.  Psychiatric:        Attention and Perception: Attention and perception normal.        Mood and Affect: Mood and affect normal.        Speech: Speech normal.        Behavior: Behavior normal. Behavior is cooperative.        Thought Content: Thought content normal.        Cognition and Memory: Cognition and memory normal.        Judgment: Judgment normal.     Results for orders placed or performed in visit on 12/13/22  Lipid panel  Result Value Ref Range   Cholesterol, Total 140 100 - 199 mg/dL   Triglycerides 95 0 - 149 mg/dL   HDL 34 (L) >16 mg/dL   VLDL Cholesterol Cal 18 5 -  40 mg/dL    LDL Chol Calc (NIH) 88 0 - 99 mg/dL   Chol/HDL Ratio 4.1 0.0 - 5.0 ratio  CBC with Differential/Platelet  Result Value Ref Range   WBC 7.1 3.4 - 10.8 x10E3/uL   RBC 4.98 4.14 - 5.80 x10E6/uL   Hemoglobin 16.1 13.0 - 17.7 g/dL   Hematocrit 78.2 95.6 - 51.0 %   MCV 91 79 - 97 fL   MCH 32.3 26.6 - 33.0 pg   MCHC 35.6 31.5 - 35.7 g/dL   RDW 21.3 08.6 - 57.8 %   Platelets 291 150 - 450 x10E3/uL   Neutrophils 55 Not Estab. %   Lymphs 30 Not Estab. %   Monocytes 10 Not Estab. %   Eos 4 Not Estab. %   Basos 1 Not Estab. %   Neutrophils Absolute 3.8 1.4 - 7.0 x10E3/uL   Lymphocytes Absolute 2.1 0.7 - 3.1 x10E3/uL   Monocytes Absolute 0.7 0.1 - 0.9 x10E3/uL   EOS (ABSOLUTE) 0.3 0.0 - 0.4 x10E3/uL   Basophils Absolute 0.1 0.0 - 0.2 x10E3/uL   Immature Granulocytes 0 Not Estab. %   Immature Grans (Abs) 0.0 0.0 - 0.1 x10E3/uL  CMP14+EGFR  Result Value Ref Range   Glucose 109 (H) 70 - 99 mg/dL   BUN 14 6 - 24 mg/dL   Creatinine, Ser 4.69 0.76 - 1.27 mg/dL   eGFR 82 >62 XB/MWU/1.32   BUN/Creatinine Ratio 13 9 - 20   Sodium 140 134 - 144 mmol/L   Potassium 3.9 3.5 - 5.2 mmol/L   Chloride 100 96 - 106 mmol/L   CO2 25 20 - 29 mmol/L   Calcium 9.1 8.7 - 10.2 mg/dL   Total Protein 6.8 6.0 - 8.5 g/dL   Albumin 4.7 3.8 - 4.9 g/dL   Globulin, Total 2.1 1.5 - 4.5 g/dL   Albumin/Globulin Ratio 2.2 1.2 - 2.2   Bilirubin Total 0.5 0.0 - 1.2 mg/dL   Alkaline Phosphatase 57 44 - 121 IU/L   AST 14 0 - 40 IU/L   ALT 19 0 - 44 IU/L  Thyroid Panel With TSH  Result Value Ref Range   TSH 1.430 0.450 - 4.500 uIU/mL   T4, Total 7.9 4.5 - 12.0 ug/dL   T3 Uptake Ratio 28 24 - 39 %   Free Thyroxine Index 2.2 1.2 - 4.9  Bayer DCA Hb A1c Waived  Result Value Ref Range   HB A1C (BAYER DCA - WAIVED) 8.1 (H) 4.8 - 5.6 %  Microalbumin / creatinine urine ratio  Result Value Ref Range   Creatinine, Urine 127.6 Not Estab. mg/dL   Microalbumin, Urine 4.6 Not Estab. ug/mL   Microalb/Creat Ratio 4 0 - 29  mg/g creat       Pertinent labs & imaging results that were available during my care of the patient were reviewed by me and considered in my medical decision making.  Assessment & Plan:  Havard was seen today for diabetes.  Diagnoses and all orders for this visit:  Type 2 diabetes mellitus with other specified complication, without long-term current use of insulin A1C 9 in office today. Will increase Metformin to 1000 mg daily. If better controlled at next visit, will consider stopping glimepiride and starting Ozempic.  -     Bayer DCA Hb A1c Waived -     glimepiride (AMARYL) 4 MG tablet; Take 2 tablets (8 mg total) by mouth daily with breakfast. -     metformin (FORTAMET) 1000 MG (OSM) 24 hr  tablet; Take 1 tablet (1,000 mg total) by mouth daily with breakfast.  Seasonal allergic rhinitis due to pollen Advised to use Flonase daily and start below. Report new, worsening, or persistent symptoms.  -     levocetirizine (XYZAL) 5 MG tablet; Take 1 tablet (5 mg total) by mouth every evening.     Continue all other maintenance medications.  Follow up plan: Return in about 3 months (around 06/17/2023) for DM.   Continue healthy lifestyle choices, including diet (rich in fruits, vegetables, and lean proteins, and low in salt and simple carbohydrates) and exercise (at least 30 minutes of moderate physical activity daily).  Educational handout given for DM  The above assessment and management plan was discussed with the patient. The patient verbalized understanding of and has agreed to the management plan. Patient is aware to call the clinic if they develop any new symptoms or if symptoms persist or worsen. Patient is aware when to return to the clinic for a follow-up visit. Patient educated on when it is appropriate to go to the emergency department.   Kari Baars, FNP-C [facility] [phone]

## 2023-03-19 ENCOUNTER — Telehealth: Payer: Self-pay

## 2023-03-19 MED ORDER — METFORMIN HCL ER 500 MG PO TB24
1000.0000 mg | ORAL_TABLET | Freq: Every day | ORAL | 1 refills | Status: DC
Start: 2023-03-19 — End: 2023-05-12

## 2023-03-19 MED ORDER — METFORMIN HCL ER (OSM) 1000 MG PO TB24
1000.0000 mg | ORAL_TABLET | Freq: Every day | ORAL | 3 refills | Status: DC
Start: 2023-03-19 — End: 2023-03-19

## 2023-03-19 NOTE — Addendum Note (Signed)
Addended by: Sonny Masters on: 03/19/2023 04:34 PM   Modules accepted: Orders

## 2023-03-19 NOTE — Telephone Encounter (Signed)
Patient aware and verbalizes understanding. 

## 2023-03-19 NOTE — Addendum Note (Signed)
Addended by: Julious Payer D on: 03/19/2023 07:55 AM   Modules accepted: Orders

## 2023-03-19 NOTE — Telephone Encounter (Signed)
Patient's insurance will not pay for Metformin 1000 OSM.  Can we resend as Metformin 500 mg, take two daily.

## 2023-03-19 NOTE — Progress Notes (Signed)
Refill failed. Resent. 

## 2023-04-27 ENCOUNTER — Other Ambulatory Visit: Payer: Self-pay | Admitting: Family Medicine

## 2023-04-27 DIAGNOSIS — J301 Allergic rhinitis due to pollen: Secondary | ICD-10-CM

## 2023-05-11 ENCOUNTER — Encounter: Payer: Self-pay | Admitting: Family Medicine

## 2023-05-12 ENCOUNTER — Other Ambulatory Visit: Payer: Self-pay | Admitting: *Deleted

## 2023-05-12 DIAGNOSIS — E1169 Type 2 diabetes mellitus with other specified complication: Secondary | ICD-10-CM

## 2023-05-12 MED ORDER — METFORMIN HCL ER 500 MG PO TB24
1000.0000 mg | ORAL_TABLET | Freq: Every day | ORAL | 1 refills | Status: DC
Start: 2023-05-12 — End: 2023-10-03

## 2023-06-17 ENCOUNTER — Encounter: Payer: Self-pay | Admitting: Family Medicine

## 2023-06-17 ENCOUNTER — Ambulatory Visit: Payer: PRIVATE HEALTH INSURANCE | Admitting: Family Medicine

## 2023-06-17 VITALS — BP 131/79 | HR 87 | Temp 98.1°F | Resp 20 | Ht 69.0 in | Wt 173.5 lb

## 2023-06-17 DIAGNOSIS — I152 Hypertension secondary to endocrine disorders: Secondary | ICD-10-CM

## 2023-06-17 DIAGNOSIS — E1159 Type 2 diabetes mellitus with other circulatory complications: Secondary | ICD-10-CM | POA: Diagnosis not present

## 2023-06-17 DIAGNOSIS — E1169 Type 2 diabetes mellitus with other specified complication: Secondary | ICD-10-CM

## 2023-06-17 DIAGNOSIS — Z23 Encounter for immunization: Secondary | ICD-10-CM | POA: Diagnosis not present

## 2023-06-17 DIAGNOSIS — E785 Hyperlipidemia, unspecified: Secondary | ICD-10-CM

## 2023-06-17 LAB — BAYER DCA HB A1C WAIVED: HB A1C (BAYER DCA - WAIVED): 8.4 % — ABNORMAL HIGH (ref 4.8–5.6)

## 2023-06-17 MED ORDER — SEMAGLUTIDE(0.25 OR 0.5MG/DOS) 2 MG/3ML ~~LOC~~ SOPN
0.5000 mg | PEN_INJECTOR | SUBCUTANEOUS | 1 refills | Status: DC
Start: 2023-06-17 — End: 2023-10-03

## 2023-06-17 NOTE — Patient Instructions (Addendum)
Tips for success with GLP-1 SLG2 Mediations (and by success, how not to be super sick on your stomach): Eat small meals AVOID heavy foods (fried/ high in carbs like bread, pasta, rice) AVOID carbonated beverages (soda/ beer, as these can increase bloating) DOUBLE your water intake (will help you avoid constipation/ dehydration)   GLP-1 Medications CAN cause: Nausea Abdominal pain Increased acid reflux (sometimes presents as "sour burps") Constipation OR Diarrhea Fatigue (especially when you first start it)   Take pepside for upset and GI symptoms  Goal BP:  For patients younger than 60: Goal BP < 140/90. For patients 60 and older: Goal BP < 150/90. For patients with diabetes: Goal BP < 140/90.  Take your medications faithfully as prescribed. Maintain a healthy weight. Get at least 150 minutes of aerobic exercise per week. Minimize salt intake, less than 2000 mg per day. Minimize alcohol intake.  DASH Eating Plan DASH stands for "Dietary Approaches to Stop Hypertension." The DASH eating plan is a healthy eating plan that has been shown to reduce high blood pressure (hypertension). Additional health benefits may include reducing the risk of type 2 diabetes mellitus, heart disease, and stroke. The DASH eating plan may also help with weight loss.  WHAT DO I NEED TO KNOW ABOUT THE DASH EATING PLAN? For the DASH eating plan, you will follow these general guidelines: Choose foods with a percent daily value for sodium of less than 5% (as listed on the food label). Use salt-free seasonings or herbs instead of table salt or sea salt. Check with your health care provider or pharmacist before using salt substitutes. Eat lower-sodium products, often labeled as "lower sodium" or "no salt added." Eat fresh foods. Eat more vegetables, fruits, and low-fat dairy products. Choose whole grains. Look for the word "whole" as the first word in the ingredient list. Choose fish and skinless chicken  or [place] more often than red meat. Limit fish, poultry, and meat to 6 oz (170 g) each day. Limit sweets, desserts, sugars, and sugary drinks. Choose heart-healthy fats. Limit cheese to 1 oz (28 g) per day. Eat more home-cooked food and less restaurant, buffet, and fast food. Limit fried foods. Cook foods using methods other than frying. Limit canned vegetables. If you do use them, rinse them well to decrease the sodium. When eating at a restaurant, ask that your food be prepared with less salt, or no salt if possible.  WHAT FOODS CAN I EAT? Seek help from a dietitian for individual calorie needs.  Grains Whole grain or whole wheat bread. Brown rice. Whole grain or whole wheat pasta. Quinoa, bulgur, and whole grain cereals. Low-sodium cereals. Corn or whole wheat flour tortillas. Whole grain cornbread. Whole grain crackers. Low-sodium crackers.  Vegetables Fresh or frozen vegetables (raw, steamed, roasted, or grilled). Low-sodium or reduced-sodium tomato and vegetable juices. Low-sodium or reduced-sodium tomato sauce and paste. Low-sodium or reduced-sodium canned vegetables.   Fruits All fresh, canned (in natural juice), or frozen fruits.  Meat and Other Protein Products Ground beef (85% or leaner), grass-fed beef, or beef trimmed of fat. Skinless chicken or [place]. Ground chicken or [place]. Pork trimmed of fat. All fish and seafood. Eggs. Dried beans, peas, or lentils. Unsalted nuts and seeds. Unsalted canned beans.  Dairy Low-fat dairy products, such as skim or 1% milk, 2% or reduced-fat cheeses, low-fat ricotta or cottage cheese, or plain low-fat yogurt. Low-sodium or reduced-sodium cheeses.  Fats and Oils Tub margarines without trans fats. Light or reduced-fat mayonnaise and salad dressings (  reduced sodium). Avocado. Safflower, olive, or canola oils. Natural peanut or almond butter.  Other Unsalted popcorn and pretzels. The items listed above may not be a complete list of  recommended foods or beverages. Contact your dietitian for more options.  WHAT FOODS ARE NOT RECOMMENDED?  Grains White bread. White pasta. White rice. Refined cornbread. Bagels and croissants. Crackers that contain trans fat.  Vegetables Creamed or fried vegetables. Vegetables in a cheese sauce. Regular canned vegetables. Regular canned tomato sauce and paste. Regular tomato and vegetable juices.  Fruits Dried fruits. Canned fruit in light or heavy syrup. Fruit juice.  Meat and Other Protein Products Fatty cuts of meat. Ribs, chicken wings, bacon, sausage, bologna, salami, chitterlings, fatback, hot dogs, bratwurst, and packaged luncheon meats. Salted nuts and seeds. Canned beans with salt.  Dairy Whole or 2% milk, cream, half-and-half, and cream cheese. Whole-fat or sweetened yogurt. Full-fat cheeses or blue cheese. Nondairy creamers and whipped toppings. Processed cheese, cheese spreads, or cheese curds.  Condiments Onion and garlic salt, seasoned salt, table salt, and sea salt. Canned and packaged gravies. Worcestershire sauce. Tartar sauce. Barbecue sauce. Teriyaki sauce. Soy sauce, including reduced sodium. Steak sauce. Fish sauce. Oyster sauce. Cocktail sauce. Horseradish. Ketchup and mustard. Meat flavorings and tenderizers. Bouillon cubes. Hot sauce. Tabasco sauce. Marinades. Taco seasonings. Relishes.  Fats and Oils Butter, stick margarine, lard, shortening, ghee, and bacon fat. Coconut, palm kernel, or palm oils. Regular salad dressings.  Other Pickles and olives. Salted popcorn and pretzels.  The items listed above may not be a complete list of foods and beverages to avoid. Contact your dietitian for more information.  WHERE CAN I FIND MORE INFORMATION? National Heart, Lung, and Blood Institute: [employer] Document Released: 11/07/2011 Document Revised: 04/04/2014 Document Reviewed: 09/22/2013 [facility] Patient Information 2015  [place], [place]. This information is not intended to replace advice given to you by your health care provider. Make sure you discuss any questions you have with your health care provider.   I think that you would greatly benefit from seeing a nutritionist.  If you are interested, please call Dr. Gerilyn Pilgrim at [phone] to schedule an appointment.

## 2023-06-17 NOTE — Progress Notes (Signed)
Subjective:  Patient ID: Zachary Estrada, male    DOB: January 25, 1961, 62 y.o.   MRN: [id number]  Patient Care Team: Sonny Masters, FNP as PCP - General (Family Medicine) Blima Ledger, OD (Optometry)   Chief Complaint:  Medical Management of Chronic Issues   HPI: Zachary Estrada is a 62 y.o. male presenting on 06/17/2023 for Medical Management of Chronic Issues    1. Type 2 diabetes mellitus with other specified complication, without long-term current use of insulin (HCC) dermatitisPt presents for follow up evaluation of Type 2 diabetes mellitus.  Current symptoms include hyperglycemia. Patient denies hyperglycemia   .  Current diabetic medications include Amaryl, Glucophage XR Compliant with meds - Yes Current monitoring regimen: home blood tests - daily Home blood sugar records: fasting range: May 120-110-190s, June 190-260, July 190-300s Any episodes of hypoglycemia? no Known diabetic complications: none Cardiovascular risk factors: diabetes mellitus Eye exam current (within one year): no Podiatry yearly?  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  Yes Hep B Vaccine?  Yes Tdap Vaccine UTD?  Yes July 2024 Urine microalbumin UTD? No Is He on ACE inhibitor or angiotensin II receptor blocker?  Yes, maxzide 37.5/25mg  Is He on statin? Yes Lipitor Is He on ASA 81 mg daily?  Yes  Pt denies pancreatitis or any pancreatic disorder. Denies any personal family history of medullary thyroid cancer  2. Hypertension associated with type 2 diabetes mellitus (HCC) Complaint with meds - Yes Current Medications - Maxzide  Checking BP at home ranging NO Exercising Regularly - Yes Watching Salt intake - Yes Pertinent ROS:  Headache - No Fatigue - No Visual Disturbances - No Chest pain  - No Dyspnea - No Palpitations - No LE edema - No They report good compliance with medications and can restate their regimen by memory. No medication side effects.  BP Readings from Last 3 Encounters:  06/17/23 131/79  03/18/23 137/83  12/13/22 130/83     3. 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  Allergies and medications reviewed and updated. Data reviewed: Chart in Epic.   Past Medical History:  Diagnosis Date   Allergy    Coronary artery disease    Diabetes mellitus without complication (HCC)    GERD (gastroesophageal reflux disease)    Hypertension    Pneumothorax, acute    Raynaud disease     Past Surgical History:  Procedure Laterality Date   BACK SURGERY     PLEURAL SCARIFICATION Left     Social History   Socioeconomic History   Marital status: Single    Spouse name: Not on file   Number of children: Not on file   Years of education: Not on file   Highest education level: Some college, no degree  Occupational History   Not on file  Tobacco Use   Smoking status: Never   Smokeless tobacco: Never  Substance and Sexual Activity   Alcohol use: Yes   Drug use: Never   Sexual activity: Yes  Other Topics Concern  Not on file  Social History Narrative   Not on file   Social Determinants of Health   Financial Resource Strain: Medium Risk (03/18/2023)   Overall Financial Resource Strain (CARDIA)    Difficulty of Paying Living Expenses: Somewhat hard  Food Insecurity: Food Insecurity Present (03/18/2023)   Hunger Vital Sign    Worried About Running Out of Food in the Last Year: Sometimes true    Ran Out of Food in the Last Year: Sometimes true  Transportation Needs: No  Transportation Needs (03/18/2023)   PRAPARE - Administrator, Civil Service (Medical): No    Lack of Transportation (Non-Medical): No  Physical Activity: Sufficiently Active (03/18/2023)   Exercise Vital Sign    Days of Exercise per Week: 5 days    Minutes of Exercise per Session: 150+ min  Stress: Stress Concern Present (03/18/2023)   [employer] of Occupational Health - Occupational Stress Questionnaire    Feeling of Stress : Rather much  Social Connections: Moderately Isolated (03/18/2023)   Social Connection and Isolation Panel [NHANES]    Frequency of Communication with Friends and Family: More than three times a week    Frequency of Social Gatherings with Friends and Family: More than three times a week    Attends Religious Services: 1 to 4 times per year    Active Member of [employer] or Organizations: No    Attends Banker Meetings: Not on file    Marital Status: Widowed  Intimate Partner Violence: Unknown (03/07/2022)   Received from [facility]   HITS    Physically Hurt: Not on file    Insult or Talk Down To: Not on file    Threaten Physical Harm: Not on file    Scream or Curse: Not on file    Outpatient Encounter Medications as of 06/17/2023  Medication Sig   aspirin EC 81 MG tablet Take 81 mg by mouth daily. Swallow whole.   atorvastatin (LIPITOR) 20 MG tablet Take 10 mg by mouth daily.   fish oil-omega-3 fatty acids 1000 MG capsule Take 1 g by mouth daily.   fluticasone (FLONASE) 50 MCG/ACT nasal spray SPRAY 1 SPRAY INTO BOTH NOSTRILS DAILY.   glimepiride (AMARYL) 4 MG tablet Take 2 tablets (8 mg total) by mouth daily with breakfast.   glucose blood test strip One Touch Ultra strips. Check blood sugar 4 times daily   levocetirizine (XYZAL) 5 MG tablet Take 1 tablet (5 mg total) by mouth every evening.   metFORMIN (GLUCOPHAGE-XR) 500 MG 24 hr tablet Take 2 tablets (1,000 mg total) by mouth daily with breakfast.   Multiple Vitamins-Minerals  (ONE-A-DAY MENS 50+ ADVANTAGE PO) Take 1 tablet by mouth daily.   Semaglutide,0.25 or 0.5MG /DOS, 2 MG/3ML SOPN Inject 0.5 mg into the skin every 7 (seven) days.   triamterene-hydrochlorothiazide (MAXZIDE-25) 37.5-25 MG tablet Take 0.5 tablets by mouth daily.   No facility-administered encounter medications on file as of 06/17/2023.    Allergies  Allergen Reactions   Cephalexin Other (See Comments)   Doxycycline Other (See Comments)   Invokamet [Canagliflozin-Metformin Hcl]     Review of Systems  Constitutional: Negative.  Negative for activity change.  HENT: Negative.         Barium swallow for choking and difficulty swallow 20 years ago   Eyes: Negative.  Negative for photophobia and visual disturbance.       Glasses last exam Jan 2023  Respiratory:  Positive for choking (barium swallow 20 years ago). Negative  for cough, chest tightness and shortness of breath.   Cardiovascular: Negative.  Negative for chest pain, palpitations and leg swelling.  Gastrointestinal:  Negative for abdominal distention, abdominal pain, blood in stool, diarrhea, nausea and vomiting.       Denies pancreatitis or any pancreatic problems  Endocrine: Negative.  Negative for polydipsia, polyphagia and polyuria.  Genitourinary: Negative.  Negative for decreased urine volume and difficulty urinating.  Musculoskeletal: Negative.   Skin: Negative.   Neurological: Negative.  Negative for dizziness, weakness and headaches.  Hematological: Negative.   Psychiatric/Behavioral:  Negative for confusion, self-injury, sleep disturbance and suicidal ideas.   All other systems reviewed and are negative.       Objective:  BP 131/79   Pulse 87   Temp 98.1 F (36.7 C) (Oral)   Resp 20   Ht 5\' 9"  (1.753 m)   Wt 173 lb 8 oz (78.7 kg)   SpO2 98%   BMI 25.62 kg/m    Wt Readings from Last 3 Encounters:  06/17/23 173 lb 8 oz (78.7 kg)  03/18/23 179 lb 12.8 oz (81.6 kg)  12/13/22 183 lb 9.6 oz (83.3 kg)    Physical  Exam Vitals and nursing note reviewed.  Constitutional:      Appearance: Normal appearance. He is normal weight.  HENT:     Head: Normocephalic and atraumatic.     Right Ear: Tympanic membrane and ear canal normal.     Left Ear: Tympanic membrane and ear canal normal.     Nose: Nose normal.     Mouth/Throat:     Mouth: Mucous membranes are moist.     Pharynx: Oropharynx is clear.  Eyes:     Extraocular Movements: Extraocular movements intact.     Pupils: Pupils are equal, round, and reactive to light.  Cardiovascular:     Rate and Rhythm: Normal rate and regular rhythm.     Heart sounds: Normal heart sounds.  Pulmonary:     Effort: Pulmonary effort is normal.     Breath sounds: Normal breath sounds.  Abdominal:     General: Abdomen is flat.     Palpations: Abdomen is soft.  Musculoskeletal:        General: Normal range of motion.     Cervical back: Normal range of motion.     Right lower leg: No edema.     Left lower leg: No edema.  Skin:    General: Skin is warm and dry.     Capillary Refill: Capillary refill takes less than 2 seconds.  Neurological:     General: No focal deficit present.     Mental Status: He is alert and oriented to person, place, and time.  Psychiatric:        Mood and Affect: Mood normal.        Behavior: Behavior normal.        Thought Content: Thought content normal.        Judgment: Judgment normal.     Results for orders placed or performed in visit on 06/17/23  Bayer DCA Hb A1c Waived  Result Value Ref Range   HB A1C (BAYER DCA - WAIVED) 8.4 (H) 4.8 - 5.6 %       Pertinent labs & imaging results that were available during my care of the patient were reviewed by me and considered in my medical decision making.  Assessment & Plan:  Zachary Estrada was seen today for medical management of chronic issues.  Diagnoses and all orders  for this visit:  Type 2 diabetes mellitus with other specified complication, without long-term current use of  insulin (HCC) -     Bayer DCA Hb A1c Waived; Future -     Bayer DCA Hb A1c Waived Start Semaglutide 0.25mg  injectable weekly for 4 weeks then increase dosage to Semaglutide 0.5mg  injectable weekly. Rotate injection sites.  Lab Results  Component Value Date   HGBA1C 8.4 (H) 06/17/2023   HGBA1C 9.0 (H) 03/18/2023   HGBA1C 8.1 (H) 12/13/2022   Rx changes:  Semaglutide  started Education: Reviewed 'ABCs' of diabetes management (respective goals in parentheses):  A1C (<7), blood pressure (<130/80), BMI (<25), and cholesterol (LDL <100). Discussed pathophysiology of DM; difference between type 1 and type 2 DM. CHO counting diet discussed.  Reviewed CHO amount in various foods and how to read nutrition labels.  Discussed recommended serving sizes. Recommend check BG 7 times a week and record readings to bring to next appointment. Report persistent high or low readings.Physical activity - goal is 150 minutes per week and advance as tolerated. Adequate sleep, at least 6-8 hours per night.   Hypertension associated with type 2 diabetes mellitus (HCC) BP well controlled. Changes not made in regimen today. Goal BP is 130/80. Pt aware to report any persistent high or low readings. DASH diet and exercise encouraged. Exercise at least 150 minutes per week and increase as tolerated. Goal BMI > 25. Stress management encouraged. Avoid nicotine and tobacco product use. Avoid excessive alcohol and NSAID's. Avoid more than 2000 mg of sodium daily. Medications as prescribed. Follow up as scheduled.    Other orders Tdap vaccine greater than or equal to 7yo IM   Continue all other maintenance medications.  Follow up plan: Return in about 3 months (around 09/17/2023) for CPE.   Continue healthy lifestyle choices, including diet (rich in fruits, vegetables, and lean proteins, and low in salt and simple carbohydrates) and exercise (at least 30 minutes of moderate physical activity daily).  Educational handout given  for DASH SGLT1  The above assessment and management plan was discussed with the patient. The patient verbalized understanding of and has agreed to the management plan. Patient is aware to call the clinic if they develop any new symptoms or if symptoms persist or worsen. Patient is aware when to return to the clinic for a follow-up visit. Patient educated on when it is appropriate to go to the emergency department.   Maryelizabeth Kaufmann NP student [facility] [phone]  I personally was present during the history, physical exam, and medical decision-making activities of this visit and have verified that the services and findings are accurately documented in the nurse practitioner student's note.  Kari Baars, FNP-C [facility] [address] [place], [place] [address] [phone]

## 2023-06-19 ENCOUNTER — Encounter: Payer: Self-pay | Admitting: Family Medicine

## 2023-06-27 ENCOUNTER — Other Ambulatory Visit: Payer: Self-pay | Admitting: Family Medicine

## 2023-06-27 DIAGNOSIS — J301 Allergic rhinitis due to pollen: Secondary | ICD-10-CM

## 2023-07-16 ENCOUNTER — Other Ambulatory Visit: Payer: Self-pay

## 2023-07-16 ENCOUNTER — Other Ambulatory Visit: Payer: Self-pay | Admitting: Family Medicine

## 2023-07-16 MED ORDER — ATORVASTATIN CALCIUM 20 MG PO TABS
10.0000 mg | ORAL_TABLET | Freq: Every day | ORAL | 1 refills | Status: DC
  Filled 2023-07-16: qty 90, 180d supply, fill #0

## 2023-07-17 ENCOUNTER — Other Ambulatory Visit: Payer: Self-pay

## 2023-07-17 MED ORDER — ATORVASTATIN CALCIUM 20 MG PO TABS: 20.0000 mg | ORAL_TABLET | Freq: Every day | ORAL | 0 refills | Status: DC

## 2023-08-29 ENCOUNTER — Other Ambulatory Visit: Payer: Self-pay | Admitting: Family Medicine

## 2023-09-02 ENCOUNTER — Other Ambulatory Visit: Payer: Self-pay | Admitting: Family Medicine

## 2023-09-02 ENCOUNTER — Encounter: Payer: Self-pay | Admitting: Family Medicine

## 2023-09-02 DIAGNOSIS — E1169 Type 2 diabetes mellitus with other specified complication: Secondary | ICD-10-CM

## 2023-10-03 ENCOUNTER — Encounter: Payer: Self-pay | Admitting: Family Medicine

## 2023-10-03 ENCOUNTER — Telehealth: Payer: Self-pay | Admitting: Family Medicine

## 2023-10-03 ENCOUNTER — Ambulatory Visit (INDEPENDENT_AMBULATORY_CARE_PROVIDER_SITE_OTHER): Payer: PRIVATE HEALTH INSURANCE | Admitting: Family Medicine

## 2023-10-03 VITALS — BP 115/78 | HR 71 | Temp 97.3°F | Ht 69.0 in | Wt 171.8 lb

## 2023-10-03 DIAGNOSIS — E1169 Type 2 diabetes mellitus with other specified complication: Secondary | ICD-10-CM

## 2023-10-03 DIAGNOSIS — Z0001 Encounter for general adult medical examination with abnormal findings: Secondary | ICD-10-CM

## 2023-10-03 DIAGNOSIS — Z125 Encounter for screening for malignant neoplasm of prostate: Secondary | ICD-10-CM

## 2023-10-03 DIAGNOSIS — Z23 Encounter for immunization: Secondary | ICD-10-CM | POA: Diagnosis not present

## 2023-10-03 DIAGNOSIS — E785 Hyperlipidemia, unspecified: Secondary | ICD-10-CM

## 2023-10-03 DIAGNOSIS — E1159 Type 2 diabetes mellitus with other circulatory complications: Secondary | ICD-10-CM | POA: Diagnosis not present

## 2023-10-03 DIAGNOSIS — I152 Hypertension secondary to endocrine disorders: Secondary | ICD-10-CM

## 2023-10-03 DIAGNOSIS — Z7984 Long term (current) use of oral hypoglycemic drugs: Secondary | ICD-10-CM

## 2023-10-03 DIAGNOSIS — Z0000 Encounter for general adult medical examination without abnormal findings: Secondary | ICD-10-CM

## 2023-10-03 DIAGNOSIS — Z130 Encounter for screening for diseases of the blood and blood-forming organs and certain disorders involving the immune mechanism: Secondary | ICD-10-CM

## 2023-10-03 LAB — BAYER DCA HB A1C WAIVED: HB A1C (BAYER DCA - WAIVED): 9.2 % — ABNORMAL HIGH (ref 4.8–5.6)

## 2023-10-03 MED ORDER — GLIMEPIRIDE 4 MG PO TABS: 8.0000 mg | ORAL_TABLET | Freq: Every day | ORAL | 3 refills | Status: DC

## 2023-10-03 MED ORDER — METFORMIN HCL ER 500 MG PO TB24: 1000.0000 mg | ORAL_TABLET | Freq: Every day | ORAL | 1 refills | Status: DC

## 2023-10-03 MED ORDER — ATORVASTATIN CALCIUM 20 MG PO TABS: 20.0000 mg | ORAL_TABLET | Freq: Every day | ORAL | 1 refills | Status: DC

## 2023-10-03 NOTE — Progress Notes (Signed)
Complete physical exam  Patient: Zachary Estrada   DOB: June 14, 1961   62 y.o. Male  MRN: [id number]  Subjective:    Chief Complaint  Patient presents with   Annual Exam    Zachary Estrada is a 62 y.o. male who presents today for a complete physical exam. He reports consuming a general diet. The patient has a physically strenuous job, but has no regular exercise apart from work.  He generally feels well. He reports sleeping well. He does not have additional problems to discuss today.   Discussed the use of AI scribe software for clinical note transcription with the patient, who gave verbal consent to proceed.  History of Present Illness   The patient, a known diabetic, presents for a routine physical. They report a recent increase in blood glucose levels, with a reading of 243 mg/dL in the morning, despite maintaining a diet of low-sugar foods and drinks. They attribute the high reading to a chicken BLT sandwich from [employer] the previous night. The patient is currently on Metformin and Glimepiride for diabetes management, but had a severe reaction to Invokamet in the past, which resulted in a two-week hospital stay due to sepsis.  The patient also reports a change in employment, which temporarily left them without insurance coverage. They are due to regain their benefits at the end of November. Despite the lack of insurance, the patient has been diligent in managing their diabetes, with regular blood sugar checks at home.  In terms of urinary symptoms, the patient reports waking up once a night to urinate, which they attribute to their high water intake. They deny any changes in bowel habits, vision, or any symptoms of increased thirst or hunger.  The patient also mentions a history of Raynaud's, which seems to flare up during the cold season, causing their hands to turn purple. They report no other significant symptoms or changes in their health.  The patient maintains an active lifestyle,  recently returning to the gym after a period of inactivity due to long work hours. They report no issues with their cholesterol medication and deny any recent illnesses.       Most recent fall risk assessment:    10/03/2023    3:55 PM  Fall Risk   Falls in the past year? 0     Most recent depression screenings:    10/03/2023    3:55 PM 06/17/2023    3:15 PM  PHQ 2/9 Scores  PHQ - 2 Score 0 0  PHQ- 9 Score 0 0    Vision:Within last year and Dental: No current dental problems  Patient Active Problem List   Diagnosis Date Noted   Allergic rhinitis 12/04/2021   Chronic fatigue syndrome 12/04/2021   Gastro-esophageal reflux disease without esophagitis 12/04/2021   Hyperlipidemia associated with type 2 diabetes mellitus (HCC) 12/04/2021   Coronary artery disease involving native coronary artery of native heart without angina pectoris    Diabetes mellitus (HCC)    Hypertension associated with type 2 diabetes mellitus (HCC)    Raynaud disease    Past Medical History:  Diagnosis Date   Allergy    Coronary artery disease    Diabetes mellitus without complication (HCC)    GERD (gastroesophageal reflux disease)    Hypertension    Pneumothorax, acute    Raynaud disease    Past Surgical History:  Procedure Laterality Date   BACK SURGERY     PLEURAL SCARIFICATION Left    Social History  Tobacco Use   Smoking status: Never   Smokeless tobacco: Never  Substance Use Topics   Alcohol use: Yes   Drug use: Never   Social History   Socioeconomic History   Marital status: Single    Spouse name: Not on file   Number of children: Not on file   Years of education: Not on file   Highest education level: Some college, no degree  Occupational History   Not on file  Tobacco Use   Smoking status: Never   Smokeless tobacco: Never  Substance and Sexual Activity   Alcohol use: Yes   Drug use: Never   Sexual activity: Yes  Other Topics Concern   Not on file  Social History  Narrative   Not on file   Social Determinants of Health   Financial Resource Strain: Medium Risk (03/18/2023)   Overall Financial Resource Strain (CARDIA)    Difficulty of Paying Living Expenses: Somewhat hard  Food Insecurity: Food Insecurity Present (03/18/2023)   Hunger Vital Sign    Worried About Running Out of Food in the Last Year: Sometimes true    Ran Out of Food in the Last Year: Sometimes true  Transportation Needs: No Transportation Needs (03/18/2023)   PRAPARE - Administrator, Civil Service (Medical): No    Lack of Transportation (Non-Medical): No  Physical Activity: Sufficiently Active (03/18/2023)   Exercise Vital Sign    Days of Exercise per Week: 5 days    Minutes of Exercise per Session: 150+ min  Stress: Stress Concern Present (03/18/2023)   [employer] of Occupational Health - Occupational Stress Questionnaire    Feeling of Stress : Rather much  Social Connections: Moderately Isolated (03/18/2023)   Social Connection and Isolation Panel [NHANES]    Frequency of Communication with Friends and Family: More than three times a week    Frequency of Social Gatherings with Friends and Family: More than three times a week    Attends Religious Services: 1 to 4 times per year    Active Member of [employer] or Organizations: No    Attends Banker Meetings: Not on file    Marital Status: Widowed  Intimate Partner Violence: Unknown (03/07/2022)   Received from [facility]   HITS    Physically Hurt: Not on file    Insult or Talk Down To: Not on file    Threaten Physical Harm: Not on file    Scream or Curse: Not on file   Family Status  Relation Name Status   Mother  Alive   Father  Deceased   MGM  Deceased   MGF  Deceased   PGM  Deceased   PGF  Deceased  No partnership data on file   Family History  Problem Relation Age of Onset   Heart disease Mother    Dementia Mother    Heart disease Father    Hypertension Father     Diabetes Father    Cancer Father    Allergies  Allergen Reactions   Cephalexin Other (See Comments)   Doxycycline Other (See Comments)   Invokamet [Canagliflozin-Metformin Hcl]       Patient Care Team: Sonny Masters, FNP as PCP - General (Family Medicine) Blima Ledger, OD (Optometry)   Outpatient Medications Prior to Visit  Medication Sig   aspirin EC 81 MG tablet Take 81 mg by mouth daily. Swallow whole.   fish oil-omega-3 fatty acids 1000 MG capsule Take 1 g by mouth  daily.   fluticasone (FLONASE) 50 MCG/ACT nasal spray SPRAY 1 SPRAY INTO BOTH NOSTRILS DAILY.   glucose blood test strip One Touch Ultra strips. Check blood sugar 4 times daily   levocetirizine (XYZAL) 5 MG tablet TAKE 1 TABLET BY MOUTH EVERY DAY IN THE EVENING   Multiple Vitamins-Minerals (ONE-A-DAY MENS 50+ ADVANTAGE PO) Take 1 tablet by mouth daily.   triamterene-hydrochlorothiazide (MAXZIDE-25) 37.5-25 MG tablet TAKE 1/2 TABLET BY MOUTH DAILY   [DISCONTINUED] atorvastatin (LIPITOR) 20 MG tablet Take 1 tablet (20 mg total) by mouth daily.   [DISCONTINUED] glimepiride (AMARYL) 4 MG tablet Take 2 tablets (8 mg total) by mouth daily with breakfast.   [DISCONTINUED] metFORMIN (GLUCOPHAGE-XR) 500 MG 24 hr tablet Take 2 tablets (1,000 mg total) by mouth daily with breakfast.   [DISCONTINUED] Semaglutide,0.25 or 0.5MG /DOS, 2 MG/3ML SOPN Inject 0.5 mg into the skin every 7 (seven) days.   No facility-administered medications prior to visit.    Review of Systems  Constitutional:  Negative for chills, diaphoresis, fever, malaise/fatigue and weight loss.  Eyes:  Negative for blurred vision, double vision, photophobia, pain, discharge and redness.  Cardiovascular:  Negative for chest pain, palpitations, orthopnea, claudication, leg swelling and PND.  Genitourinary:  Negative for dysuria, flank pain, frequency, hematuria and urgency.  Skin:  Negative for itching and rash.  Neurological:  Negative for dizziness, tingling,  tremors, sensory change, speech change, focal weakness, seizures, loss of consciousness, weakness and headaches.  Psychiatric/Behavioral:  Negative for depression, hallucinations, memory loss, substance abuse and suicidal ideas. The patient is not nervous/anxious and does not have insomnia.   All other systems reviewed and are negative.         Objective:     BP 115/78   Pulse 71   Temp (!) 97.3 F (36.3 C) (Temporal)   Ht 5\' 9"  (1.753 m)   Wt 171 lb 12.8 oz (77.9 kg)   SpO2 98%   BMI 25.37 kg/m  BP Readings from Last 3 Encounters:  10/03/23 115/78  06/17/23 131/79  03/18/23 137/83   Wt Readings from Last 3 Encounters:  10/03/23 171 lb 12.8 oz (77.9 kg)  06/17/23 173 lb 8 oz (78.7 kg)  03/18/23 179 lb 12.8 oz (81.6 kg)   SpO2 Readings from Last 3 Encounters:  10/03/23 98%  06/17/23 98%  03/18/23 97%      Physical Exam Vitals and nursing note reviewed.  Constitutional:      General: He is not in acute distress.    Appearance: Normal appearance. He is well-developed, well-groomed and normal weight. He is not ill-appearing, toxic-appearing or diaphoretic.  HENT:     Head: Normocephalic and atraumatic.     Jaw: There is normal jaw occlusion.     Right Ear: Hearing, tympanic membrane, ear canal and external ear normal.     Left Ear: Hearing, tympanic membrane, ear canal and external ear normal.     Nose: Nose normal.     Mouth/Throat:     Lips: Pink.     Mouth: Mucous membranes are moist.     Pharynx: Oropharynx is clear. Uvula midline.  Eyes:     General: Lids are normal.     Extraocular Movements: Extraocular movements intact.     Conjunctiva/sclera: Conjunctivae normal.     Pupils: Pupils are equal, round, and reactive to light.  Neck:     Thyroid: No thyroid mass, thyromegaly or thyroid tenderness.     Vascular: No carotid bruit or JVD.  Trachea: Trachea and phonation normal.  Cardiovascular:     Rate and Rhythm: Normal rate and regular rhythm.      Chest Wall: PMI is not displaced.     Pulses: Normal pulses.     Heart sounds: Normal heart sounds. No murmur heard.    No friction rub. No gallop.  Pulmonary:     Effort: Pulmonary effort is normal. No respiratory distress.     Breath sounds: Normal breath sounds. No wheezing.  Abdominal:     General: Bowel sounds are normal. There is no distension or abdominal bruit.     Palpations: Abdomen is soft. There is no hepatomegaly or splenomegaly.     Tenderness: There is no abdominal tenderness. There is no right CVA tenderness or left CVA tenderness.     Hernia: No hernia is present.  Musculoskeletal:        General: Normal range of motion.     Cervical back: Normal range of motion and neck supple.     Right lower leg: No edema.     Left lower leg: No edema.  Lymphadenopathy:     Cervical: No cervical adenopathy.  Skin:    General: Skin is warm and dry.     Capillary Refill: Capillary refill takes less than 2 seconds.     Coloration: Skin is not cyanotic, jaundiced or pale.     Findings: No rash.  Neurological:     General: No focal deficit present.     Mental Status: He is alert and oriented to person, place, and time.     Sensory: Sensation is intact.     Motor: Motor function is intact.     Coordination: Coordination is intact.     Gait: Gait is intact.     Deep Tendon Reflexes: Reflexes are normal and symmetric.  Psychiatric:        Attention and Perception: Attention and perception normal.        Mood and Affect: Mood and affect normal.        Speech: Speech normal.        Behavior: Behavior normal. Behavior is cooperative.        Thought Content: Thought content normal.        Cognition and Memory: Cognition and memory normal.        Judgment: Judgment normal.      Last CBC Lab Results  Component Value Date   WBC 7.1 12/13/2022   HGB 16.1 12/13/2022   HCT 45.2 12/13/2022   MCV 91 12/13/2022   MCH 32.3 12/13/2022   RDW 11.6 12/13/2022   PLT 291 12/13/2022    Last metabolic panel Lab Results  Component Value Date   GLUCOSE 109 (H) 12/13/2022   NA 140 12/13/2022   K 3.9 12/13/2022   CL 100 12/13/2022   CO2 25 12/13/2022   BUN 14 12/13/2022   CREATININE 1.05 12/13/2022   EGFR 82 12/13/2022   CALCIUM 9.1 12/13/2022   PROT 6.8 12/13/2022   ALBUMIN 4.7 12/13/2022   LABGLOB 2.1 12/13/2022   AGRATIO 2.2 12/13/2022   BILITOT 0.5 12/13/2022   ALKPHOS 57 12/13/2022   AST 14 12/13/2022   ALT 19 12/13/2022   ANIONGAP 12 06/11/2021   Last lipids Lab Results  Component Value Date   CHOL 140 12/13/2022   HDL 34 (L) 12/13/2022   LDLCALC 88 12/13/2022   TRIG 95 12/13/2022   CHOLHDL 4.1 12/13/2022   Last hemoglobin A1c Lab Results  Component Value Date  HGBA1C 8.4 (H) 06/17/2023   Last thyroid functions Lab Results  Component Value Date   TSH 1.430 12/13/2022   T4TOTAL 7.9 12/13/2022       Assessment & Plan:    Routine Health Maintenance and Physical Exam  Immunization History  Administered Date(s) Administered   DTaP 10/20/2009   Influenza Split 10/20/2009, 10/11/2015, 09/02/2019, 01/05/2021   Influenza, Seasonal, Injecte, Preservative Fre 10/03/2023   Influenza,inj,Quad PF,6+ Mos 10/05/2010, 10/11/2011, 09/27/2016, 08/15/2018, 11/12/2022   PFIZER(Purple Top)SARS-COV-2 Vaccination 07/22/2020, 08/16/2020   Td 02/05/1997   Tdap 10/20/2009, 06/17/2023   Zoster Recombinant(Shingrix) 08/10/2019, 11/05/2019   Zoster, Live 05/30/2014, 09/02/2019    Health Maintenance  Topic Date Due   OPHTHALMOLOGY EXAM  05/15/2023   COVID-19 Vaccine (3 - 2023-24 season) 10/19/2023 (Originally 08/03/2023)   Hepatitis C Screening  12/14/2023 (Originally 10/10/1981)   HIV Screening  12/14/2023 (Originally 10/10/1978)   Diabetic kidney evaluation - eGFR measurement  12/14/2023   Diabetic kidney evaluation - Urine ACR  12/14/2023   FOOT EXAM  12/14/2023   HEMOGLOBIN A1C  12/18/2023   Colonoscopy  04/18/2032   DTaP/Tdap/Td (5 - Td or Tdap)  06/16/2033   INFLUENZA VACCINE  Completed   Zoster Vaccines- Shingrix  Completed   HPV VACCINES  Aged Out    Discussed health benefits of physical activity, and encouraged him to engage in regular exercise appropriate for his age and condition.  Problem List Items Addressed This Visit       Cardiovascular and Mediastinum   Hypertension associated with type 2 diabetes mellitus (HCC)   Relevant Medications   atorvastatin (LIPITOR) 20 MG tablet   metFORMIN (GLUCOPHAGE-XR) 500 MG 24 hr tablet   glimepiride (AMARYL) 4 MG tablet   Other Relevant Orders   CBC with Differential/Platelet   CMP14+EGFR     Endocrine   Diabetes mellitus (HCC)   Relevant Medications   atorvastatin (LIPITOR) 20 MG tablet   metFORMIN (GLUCOPHAGE-XR) 500 MG 24 hr tablet   glimepiride (AMARYL) 4 MG tablet   Other Relevant Orders   Lipid panel   CMP14+EGFR   Bayer DCA Hb A1c Waived   Vitamin B12   Thyroid Panel With TSH   Hyperlipidemia associated with type 2 diabetes mellitus (HCC)   Relevant Medications   atorvastatin (LIPITOR) 20 MG tablet   metFORMIN (GLUCOPHAGE-XR) 500 MG 24 hr tablet   glimepiride (AMARYL) 4 MG tablet   Other Relevant Orders   Lipid panel   CMP14+EGFR   Thyroid Panel With TSH   PSA, total and free   Other Visit Diagnoses     Annual physical exam    -  Primary   Relevant Orders   CBC with Differential/Platelet   CMP14+EGFR   Bayer DCA Hb A1c Waived   Vitamin B12   Thyroid Panel With TSH   PSA, total and free   Screening for prostate cancer       Relevant Orders   PSA, total and free   Screening for deficiency anemia       Relevant Orders   CBC with Differential/Platelet   Encounter for immunization       Relevant Orders   Flu vaccine trivalent PF, 6mos and older(Flulaval,Afluria,Fluarix,Fluzone) (Completed)     Assessment and Plan    Type 2 Diabetes Mellitus Elevated morning blood glucose (243) and recent HbA1c (9.2) despite adherence to Metformin 1000mg  and  Glimepiride 8mg  daily. Discussed the impact of diet and potential effects of aspartame on blood glucose levels. Previous adverse reaction to  Invokamet noted. -Continue Metformin 1000mg  and Glimepiride 8mg  daily. -Refill Metformin prescription. -Consider adding Ozempic once insurance is reinstated at the end of November. -Ozempic samples to bridge until insurance coverage.  General Health Maintenance -Administer influenza vaccine today. -Plan for eye exam in December once insurance is reinstated.  Raynaud's Phenomenon Reports hands turning purple during cold season. No current distress. -Continue current management, ensure hands are kept warm during cold weather.       Return in about 3 months (around 01/03/2024) for DM.     Kari Baars, FNP

## 2023-10-03 NOTE — Patient Instructions (Signed)

## 2023-10-04 LAB — CMP14+EGFR
ALT: 21 [IU]/L (ref 0–44)
AST: 18 [IU]/L (ref 0–40)
Albumin: 4.2 g/dL (ref 3.8–4.9)
Alkaline Phosphatase: 69 [IU]/L (ref 44–121)
BUN/Creatinine Ratio: 10 (ref 9–20)
BUN: 9 mg/dL (ref 6–24)
Bilirubin Total: 0.9 mg/dL (ref 0.0–1.2)
CO2: 23 mmol/L (ref 20–29)
Calcium: 9.5 mg/dL (ref 8.7–10.2)
Chloride: 101 mmol/L (ref 96–106)
Creatinine, Ser: 0.9 mg/dL (ref 0.76–1.27)
Globulin, Total: 2.6 g/dL (ref 1.5–4.5)
Glucose: 182 mg/dL — ABNORMAL HIGH (ref 70–99)
Potassium: 4.2 mmol/L (ref 3.5–5.2)
Sodium: 141 mmol/L (ref 134–144)
Total Protein: 6.8 g/dL (ref 6.0–8.5)
eGFR: 98 mL/min/{1.73_m2} (ref >59)

## 2023-10-04 LAB — CBC WITH DIFFERENTIAL/PLATELET
Basophils Absolute: 0.1 10*3/uL (ref 0.0–0.2)
Basos: 1 %
EOS (ABSOLUTE): 0.3 10*3/uL (ref 0.0–0.4)
Eos: 4 %
Hematocrit: 48.2 % (ref 37.5–51.0)
Hemoglobin: 16.2 g/dL (ref 13.0–17.7)
Immature Grans (Abs): 0 10*3/uL (ref 0.0–0.1)
Immature Granulocytes: 0 %
Lymphocytes Absolute: 2.7 10*3/uL (ref 0.7–3.1)
Lymphs: 33 %
MCH: 31.6 pg (ref 26.6–33.0)
MCHC: 33.6 g/dL (ref 31.5–35.7)
MCV: 94 fL (ref 79–97)
Monocytes Absolute: 0.8 10*3/uL (ref 0.1–0.9)
Monocytes: 10 %
Neutrophils Absolute: 4.3 10*3/uL (ref 1.4–7.0)
Neutrophils: 52 %
Platelets: 312 10*3/uL (ref 150–450)
RBC: 5.13 x10E6/uL (ref 4.14–5.80)
RDW: 11.8 % (ref 11.6–15.4)
WBC: 8.3 10*3/uL (ref 3.4–10.8)

## 2023-10-04 LAB — THYROID PANEL WITH TSH
Free Thyroxine Index: 2.2 (ref 1.2–4.9)
T3 Uptake Ratio: 28 % (ref 24–39)
T4, Total: 7.9 ug/dL (ref 4.5–12.0)
TSH: 1.49 u[IU]/mL (ref 0.450–4.500)

## 2023-10-04 LAB — PSA, TOTAL AND FREE
PSA, Free Pct: 20.8 %
PSA, Free: 0.25 ng/mL
Prostate Specific Ag, Serum: 1.2 ng/mL (ref 0.0–4.0)

## 2023-10-04 LAB — LIPID PANEL
Chol/HDL Ratio: 3.9 ratio (ref 0.0–5.0)
Cholesterol, Total: 132 mg/dL (ref 100–199)
HDL: 34 mg/dL — ABNORMAL LOW (ref >39)
LDL Chol Calc (NIH): 77 mg/dL (ref 0–99)
Triglycerides: 112 mg/dL (ref 0–149)
VLDL Cholesterol Cal: 21 mg/dL (ref 5–40)

## 2023-10-04 LAB — VITAMIN B12: Vitamin B-12: 900 pg/mL (ref 232–1245)

## 2023-10-09 ENCOUNTER — Encounter: Payer: Self-pay | Admitting: Family Medicine

## 2023-11-04 ENCOUNTER — Other Ambulatory Visit: Payer: Self-pay | Admitting: Family Medicine

## 2023-11-04 ENCOUNTER — Ambulatory Visit (INDEPENDENT_AMBULATORY_CARE_PROVIDER_SITE_OTHER): Payer: No Typology Code available for payment source

## 2023-11-04 DIAGNOSIS — M79601 Pain in right arm: Secondary | ICD-10-CM | POA: Diagnosis not present

## 2023-11-04 DIAGNOSIS — M79641 Pain in right hand: Secondary | ICD-10-CM

## 2023-11-04 DIAGNOSIS — R52 Pain, unspecified: Secondary | ICD-10-CM

## 2023-12-06 ENCOUNTER — Other Ambulatory Visit: Payer: Self-pay | Admitting: Family Medicine

## 2023-12-06 DIAGNOSIS — J301 Allergic rhinitis due to pollen: Secondary | ICD-10-CM

## 2024-01-06 ENCOUNTER — Ambulatory Visit: Payer: PRIVATE HEALTH INSURANCE | Admitting: Family Medicine

## 2024-01-14 ENCOUNTER — Encounter: Payer: Self-pay | Admitting: Family Medicine

## 2024-01-15 ENCOUNTER — Telehealth: Payer: Self-pay | Admitting: Pharmacist

## 2024-01-15 NOTE — Telephone Encounter (Signed)
Chart review performed for patient assistance per patient/PCP request Patient has commercial insurance which makes him ineligible to apply for manufacturer patient assistance I would recommend patient call insurance to see if he has a deductible or what brand name type 2 diabetes medications they will cover at a reasonable price Alma Friendly, trulicity?!) Will send to patient in my chart message

## 2024-01-28 ENCOUNTER — Encounter: Payer: Self-pay | Admitting: Family Medicine

## 2024-02-27 ENCOUNTER — Encounter: Payer: Self-pay | Admitting: Family Medicine

## 2024-02-27 ENCOUNTER — Other Ambulatory Visit: Payer: Self-pay | Admitting: Family Medicine

## 2024-02-27 DIAGNOSIS — E1169 Type 2 diabetes mellitus with other specified complication: Secondary | ICD-10-CM

## 2024-02-27 MED ORDER — METFORMIN HCL ER 500 MG PO TB24: 1000.0000 mg | ORAL_TABLET | Freq: Every day | ORAL | 0 refills | Status: DC

## 2024-03-03 ENCOUNTER — Encounter: Payer: Self-pay | Admitting: Family Medicine

## 2024-03-03 ENCOUNTER — Ambulatory Visit (INDEPENDENT_AMBULATORY_CARE_PROVIDER_SITE_OTHER)

## 2024-03-03 ENCOUNTER — Ambulatory Visit (INDEPENDENT_AMBULATORY_CARE_PROVIDER_SITE_OTHER): Admitting: Family Medicine

## 2024-03-03 VITALS — BP 134/83 | HR 66 | Temp 97.0°F | Ht 69.0 in | Wt 182.4 lb

## 2024-03-03 DIAGNOSIS — R202 Paresthesia of skin: Secondary | ICD-10-CM

## 2024-03-03 DIAGNOSIS — M79601 Pain in right arm: Secondary | ICD-10-CM

## 2024-03-03 MED ORDER — NAPROXEN 500 MG PO TABS: 500.0000 mg | ORAL_TABLET | Freq: Two times a day (BID) | ORAL | 0 refills | Status: AC
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
## 2024-03-10 ENCOUNTER — Encounter: Payer: Self-pay | Admitting: Family Medicine

## 2024-03-19 ENCOUNTER — Encounter: Payer: Self-pay | Admitting: Family Medicine

## 2024-03-22 ENCOUNTER — Other Ambulatory Visit: Payer: Self-pay

## 2024-03-22 DIAGNOSIS — M79601 Pain in right arm: Secondary | ICD-10-CM

## 2024-03-22 DIAGNOSIS — R202 Paresthesia of skin: Secondary | ICD-10-CM

## 2024-03-29 DIAGNOSIS — K5730 Diverticulosis of large intestine without perforation or abscess without bleeding: Secondary | ICD-10-CM | POA: Insufficient documentation

## 2024-03-29 DIAGNOSIS — K921 Melena: Secondary | ICD-10-CM | POA: Insufficient documentation

## 2024-03-29 DIAGNOSIS — F430 Acute stress reaction: Secondary | ICD-10-CM | POA: Insufficient documentation

## 2024-03-30 ENCOUNTER — Ambulatory Visit (INDEPENDENT_AMBULATORY_CARE_PROVIDER_SITE_OTHER): Admitting: Orthopedic Surgery

## 2024-03-30 ENCOUNTER — Other Ambulatory Visit: Payer: Self-pay | Admitting: Family Medicine

## 2024-03-30 ENCOUNTER — Encounter: Payer: Self-pay | Admitting: Orthopedic Surgery

## 2024-03-30 VITALS — BP 142/88 | HR 86 | Ht 69.0 in | Wt 175.0 lb

## 2024-03-30 DIAGNOSIS — M79641 Pain in right hand: Secondary | ICD-10-CM

## 2024-03-30 DIAGNOSIS — E1169 Type 2 diabetes mellitus with other specified complication: Secondary | ICD-10-CM

## 2024-03-30 MED ORDER — GABAPENTIN 100 MG PO CAPS: 100.0000 mg | ORAL_CAPSULE | Freq: Three times a day (TID) | ORAL | 0 refills | Status: DC

## 2024-03-30 NOTE — Patient Instructions (Addendum)
 Occupational therapy has been ordered for you at [facility] We will check with workers comp for approval, if you do not hear anything with appointment in one week please check with your workers compensation adjustor
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
## 2024-04-01 ENCOUNTER — Ambulatory Visit: Admitting: Family Medicine

## 2024-04-12 ENCOUNTER — Telehealth: Payer: Self-pay | Admitting: Orthopedic Surgery

## 2024-04-12 NOTE — Telephone Encounter (Signed)
 Zachary Estrada

## 2024-04-12 NOTE — Telephone Encounter (Signed)
 Dr. Ernesta Heading pt - Kristen w/[employer] lvm 5/09 12:21pm - [phone] and ref the case ID # requesting a PT order for OT to be faxed to [phone] ATTN Case ID # [id number]

## 2024-04-12 NOTE — Telephone Encounter (Signed)
 Faxed

## 2024-04-13 ENCOUNTER — Telehealth: Payer: Self-pay | Admitting: Orthopedic Surgery

## 2024-04-13 NOTE — Telephone Encounter (Signed)
 Referral for P.T. faxed to [facility] work comp/ [id number], [id number], ph [phone]

## 2024-04-17 ENCOUNTER — Other Ambulatory Visit: Payer: Self-pay | Admitting: Family Medicine

## 2024-04-17 DIAGNOSIS — E1169 Type 2 diabetes mellitus with other specified complication: Secondary | ICD-10-CM

## 2024-04-19 MED ORDER — METFORMIN HCL ER 500 MG PO TB24: 1000.0000 mg | ORAL_TABLET | Freq: Every day | ORAL | 0 refills | Status: DC

## 2024-04-19 NOTE — Addendum Note (Signed)
 Addended by: Harrie Cazarez D on: 04/19/2024 10:04 AM   Modules accepted: Orders

## 2024-04-19 NOTE — Telephone Encounter (Signed)
 Zachary Estrada pt NTBS 30-d given 02/27/24

## 2024-04-19 NOTE — Telephone Encounter (Signed)
 Appt 05-04-24 with Rakes.

## 2024-04-21 ENCOUNTER — Other Ambulatory Visit: Payer: Self-pay | Admitting: Family Medicine

## 2024-04-21 ENCOUNTER — Encounter: Payer: Self-pay | Admitting: Family Medicine

## 2024-04-21 DIAGNOSIS — E1169 Type 2 diabetes mellitus with other specified complication: Secondary | ICD-10-CM

## 2024-04-26 ENCOUNTER — Other Ambulatory Visit: Payer: Self-pay | Admitting: Orthopedic Surgery

## 2024-05-05 ENCOUNTER — Ambulatory Visit (INDEPENDENT_AMBULATORY_CARE_PROVIDER_SITE_OTHER): Admitting: Family Medicine

## 2024-05-05 ENCOUNTER — Ambulatory Visit: Payer: Self-pay | Admitting: Family Medicine

## 2024-05-05 ENCOUNTER — Encounter: Payer: Self-pay | Admitting: Family Medicine

## 2024-05-05 VITALS — BP 132/88 | HR 73 | Temp 97.8°F | Ht 69.0 in | Wt 179.4 lb

## 2024-05-05 DIAGNOSIS — E1159 Type 2 diabetes mellitus with other circulatory complications: Secondary | ICD-10-CM

## 2024-05-05 DIAGNOSIS — E11649 Type 2 diabetes mellitus with hypoglycemia without coma: Secondary | ICD-10-CM

## 2024-05-05 DIAGNOSIS — L02519 Cutaneous abscess of unspecified hand: Secondary | ICD-10-CM

## 2024-05-05 DIAGNOSIS — J301 Allergic rhinitis due to pollen: Secondary | ICD-10-CM | POA: Diagnosis not present

## 2024-05-05 DIAGNOSIS — I152 Hypertension secondary to endocrine disorders: Secondary | ICD-10-CM

## 2024-05-05 DIAGNOSIS — E1169 Type 2 diabetes mellitus with other specified complication: Secondary | ICD-10-CM | POA: Diagnosis not present

## 2024-05-05 DIAGNOSIS — Z7984 Long term (current) use of oral hypoglycemic drugs: Secondary | ICD-10-CM

## 2024-05-05 DIAGNOSIS — L03119 Cellulitis of unspecified part of limb: Secondary | ICD-10-CM

## 2024-05-05 DIAGNOSIS — E785 Hyperlipidemia, unspecified: Secondary | ICD-10-CM

## 2024-05-05 LAB — BAYER DCA HB A1C WAIVED: HB A1C (BAYER DCA - WAIVED): 9.4 % — ABNORMAL HIGH (ref 4.8–5.6)

## 2024-05-05 MED ORDER — TRIAMTERENE-HCTZ 37.5-25 MG PO TABS: 0.5000 | ORAL_TABLET | Freq: Every day | ORAL | 1 refills | Status: DC

## 2024-05-05 MED ORDER — DOXYCYCLINE HYCLATE 100 MG PO TABS: 100.0000 mg | ORAL_TABLET | Freq: Two times a day (BID) | ORAL | 0 refills | Status: AC

## 2024-05-05 MED ORDER — GLIMEPIRIDE 4 MG PO TABS
8.0000 mg | ORAL_TABLET | Freq: Every day | ORAL | 1 refills | Status: DC
Start: 2024-05-05 — End: 2024-08-09

## 2024-05-05 MED ORDER — SEMAGLUTIDE(0.25 OR 0.5MG/DOS) 2 MG/3ML ~~LOC~~ SOPN: 0.2500 mg | PEN_INJECTOR | SUBCUTANEOUS | 3 refills | Status: DC

## 2024-05-05 MED ORDER — METFORMIN HCL ER 500 MG PO TB24: 1000.0000 mg | ORAL_TABLET | Freq: Every day | ORAL | 1 refills | Status: DC

## 2024-05-05 MED ORDER — LEVOCETIRIZINE DIHYDROCHLORIDE 5 MG PO TABS
5.0000 mg | ORAL_TABLET | Freq: Every evening | ORAL | 1 refills | Status: AC
Start: 2024-05-05 — End: ?

## 2024-05-05 NOTE — Progress Notes (Signed)
 Subjective:  Patient ID: Zachary Estrada, male    DOB: 1961/01/09, 63 y.o.   MRN: [id number]  Patient Care Team: Galvin Jules, FNP as PCP - General (Family Medicine) Princella Brooklyn, OD (Optometry)   Chief Complaint:  Diabetes   HPI: Zachary Estrada is a 63 y.o. male presenting on 05/05/2024 for Diabetes   Zachary Estrada "Zachary Estrada" is a 63 year old male with diabetes who presents with uncontrolled blood sugar levels.  He experiences significant fluctuations in blood sugar levels, with lows around 150 mg/dL and highs reaching up to 304 mg/dL. No symptoms are noted when his blood sugar is high. He is currently taking metformin  1000 mg and glimepiride  8 mg for diabetes management.  He recalls a dietary incident where his blood sugar spiked to 345 mg/dL after consuming a large battered seafood meal, but he was able to reduce it to 170 mg/dL within two days. He generally tries to watch his carbohydrate intake, opting for whole wheat bread and sugar-free jelly. 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  No changes in vision, increased hunger, thirst, or urination. He also reports no issues with his feet, such as numbness, tingling, or sores, and states that his blood pressure has been good without any chest pain, leg swelling, or headaches.  He recalls a past positive experience with Ozempic , which helped him lose weight, but he had to stop due to insurance coverage issues. He mentions a recent insect bite that has been slow to heal and recalls a past allergic reaction to an unspecified medication following a cat scratch incident.          Relevant past medical, surgical, family, and social history reviewed and updated as indicated.  Allergies and medications reviewed and updated. Data reviewed: Chart in Epic.   Past Medical History:  Diagnosis Date   Allergy    Coronary artery disease    Diabetes mellitus without  complication (HCC)    GERD (gastroesophageal reflux disease)    Hypertension    Pneumothorax, acute    Raynaud disease     Past Surgical History:  Procedure Laterality Date   BACK SURGERY     PLEURAL SCARIFICATION Left     Social History   Socioeconomic History   Marital status: Single    Spouse name: Not on file   Number of children: Not on file   Years of education: Not on file   Highest education level: Some college, no degree  Occupational History   Not on file  Tobacco Use   Smoking status: Never   Smokeless tobacco: Never  Substance and Sexual Activity   Alcohol use: Yes   Drug use: Never   Sexual activity: Yes  Other Topics Concern   Not on file  Social History Narrative   Not on file   Social Drivers of Health   Financial Resource Strain: Low Risk  (05/04/2024)   Overall Financial Resource Strain (CARDIA)    Difficulty of Paying Living Expenses: Not very hard  Food Insecurity: No Food Insecurity (05/04/2024)   Hunger Vital Sign    Worried About Running Out of Food in the Last Year: Never true    Ran Out of Food in the Last Year: Never true  Transportation Needs: No Transportation Needs (05/04/2024)   PRAPARE - Administrator, Civil Service (Medical): No    Lack of Transportation (Non-Medical):  No  Physical Activity: Insufficiently Active (05/04/2024)   Exercise Vital Sign    Days of Exercise per Week: 4 days    Minutes of Exercise per Session: 20 min  Stress: Stress Concern Present (05/04/2024)   [employer] of Occupational Health - Occupational Stress Questionnaire    Feeling of Stress : To some extent  Social Connections: Unknown (05/04/2024)   Social Connection and Isolation Panel [NHANES]    Frequency of Communication with Friends and Family: More than three times a week    Frequency of Social Gatherings with Friends and Family: Once a week    Attends Religious Services: Never    Database administrator or Organizations: No    Attends Probation officer: Not on file    Marital Status: Patient declined  Intimate Partner Violence: Unknown (03/07/2022)   Received from [employer], [facility]   HITS    Physically Hurt: Not on file    Insult or Talk Down To: Not on file    Threaten Physical Harm: Not on file    Scream or Curse: Not on file    Outpatient Encounter Medications as of 05/05/2024  Medication Sig   aspirin  EC 81 MG tablet Take 81 mg by mouth daily. Swallow whole.   atorvastatin  (LIPITOR) 20 MG tablet Take 1 tablet (20 mg total) by mouth daily.   doxycycline (VIBRA-TABS) 100 MG tablet Take 1 tablet (100 mg total) by mouth 2 (two) times daily for 10 days. 1 po bid   fish oil-omega-3 fatty acids 1000 MG capsule Take 1 g by mouth daily.   fluticasone  (FLONASE ) 50 MCG/ACT nasal spray SPRAY 1 SPRAY INTO BOTH NOSTRILS DAILY.   gabapentin  (NEURONTIN ) 100 MG capsule TAKE 1 CAPSULE (100 MG TOTAL) BY MOUTH THREE TIMES DAILY.   glucose blood test strip One Touch Ultra strips. Check blood sugar 4 times daily   Multiple Vitamins-Minerals (ONE-A-DAY MENS 50+ ADVANTAGE PO) Take 1 tablet by mouth daily.   Semaglutide ,0.25 or 0.5MG /DOS, 2 MG/3ML SOPN Inject 0.25 mg into the skin once a week.   glimepiride  (AMARYL ) 4 MG tablet Take 2 tablets (8 mg total) by mouth daily with breakfast.   levocetirizine (XYZAL ) 5 MG tablet Take 1 tablet (5 mg total) by mouth every evening.   metFORMIN  (GLUCOPHAGE -XR) 500 MG 24 hr tablet Take 2 tablets (1,000 mg total) by mouth daily with breakfast.   triamterene -hydrochlorothiazide  (MAXZIDE -25) 37.5-25 MG tablet Take 0.5 tablets by mouth daily.   [DISCONTINUED] glimepiride  (AMARYL ) 4 MG tablet Take 2 tablets (8 mg total) by mouth daily with breakfast.   [DISCONTINUED] levocetirizine (XYZAL ) 5 MG tablet TAKE 1 TABLET BY MOUTH EVERY DAY IN THE EVENING   [DISCONTINUED] metFORMIN  (GLUCOPHAGE -XR) 500 MG 24 hr tablet Take 2 tablets (1,000 mg total) by mouth daily with breakfast.   [DISCONTINUED]  triamterene -hydrochlorothiazide  (MAXZIDE -25) 37.5-25 MG tablet TAKE 1/2 TABLET BY MOUTH DAILY   No facility-administered encounter medications on file as of 05/05/2024.    Allergies  Allergen Reactions   Cephalexin Other (See Comments)   Invokamet [Canagliflozin-Metformin  Hcl]     Pertinent ROS per HPI, otherwise unremarkable      Objective:  BP 132/88   Pulse 73   Temp 97.8 F (36.6 C)   Ht 5\' 9"  (1.753 m)   Wt 179 lb 6.4 oz (81.4 kg)   SpO2 97%   BMI 26.49 kg/m    Wt Readings from Last 3 Encounters:  05/05/24 179 lb 6.4 oz (81.4 kg)  03/30/24  175 lb (79.4 kg)  03/03/24 182 lb 6.4 oz (82.7 kg)    Physical Exam Vitals and nursing note reviewed.  Constitutional:      General: He is not in acute distress.    Appearance: Normal appearance. He is overweight. He is not ill-appearing, toxic-appearing or diaphoretic.  HENT:     Head: Normocephalic and atraumatic.     Nose: Nose normal.     Mouth/Throat:     Mouth: Mucous membranes are moist.  Eyes:     Conjunctiva/sclera: Conjunctivae normal.     Pupils: Pupils are equal, round, and reactive to light.  Cardiovascular:     Rate and Rhythm: Normal rate and regular rhythm.     Pulses:          Dorsalis pedis pulses are 2+ on the right side and 2+ on the left side.       Posterior tibial pulses are 2+ on the right side and 2+ on the left side.     Heart sounds: Normal heart sounds.  Pulmonary:     Effort: Pulmonary effort is normal.     Breath sounds: Normal breath sounds.  Abdominal:     General: Bowel sounds are normal.     Palpations: Abdomen is soft.  Musculoskeletal:     Cervical back: Neck supple.     Right lower leg: No edema.     Left lower leg: No edema.     Right foot: Normal range of motion. No deformity, bunion, Charcot foot, foot drop or prominent metatarsal heads.     Left foot: Normal range of motion. No deformity, bunion, Charcot foot, foot drop or prominent metatarsal heads.  Feet:     Right foot:      Protective Sensation: 10 sites tested.  10 sites sensed.     Skin integrity: Skin integrity normal.     Toenail Condition: Right toenails are normal.     Left foot:     Protective Sensation: 10 sites tested.  10 sites sensed.     Skin integrity: Skin integrity normal.     Toenail Condition: Left toenails are normal.  Skin:    General: Skin is warm and dry.     Capillary Refill: Capillary refill takes less than 2 seconds.     Findings: Abscess and erythema present.       Neurological:     General: No focal deficit present.     Mental Status: He is alert and oriented to person, place, and time.  Psychiatric:        Mood and Affect: Mood normal.        Behavior: Behavior normal. Behavior is cooperative.        Thought Content: Thought content normal.        Judgment: Judgment normal.     Results for orders placed or performed in visit on 10/03/23  Bayer DCA Hb A1c Waived   Collection Time: 10/03/23  3:33 PM  Result Value Ref Range   HB A1C (BAYER DCA - WAIVED) 9.2 (H) 4.8 - 5.6 %  Lipid panel   Collection Time: 10/03/23  3:35 PM  Result Value Ref Range   Cholesterol, Total 132 100 - 199 mg/dL   Triglycerides 161 0 - 149 mg/dL   HDL 34 (L) >09 mg/dL   VLDL Cholesterol Cal 21 5 - 40 mg/dL   LDL Chol Calc (NIH) 77 0 - 99 mg/dL   Chol/HDL Ratio 3.9 0.0 - 5.0 ratio  CBC with  Differential/Platelet   Collection Time: 10/03/23  3:35 PM  Result Value Ref Range   WBC 8.3 3.4 - 10.8 x10E3/uL   RBC 5.13 4.14 - 5.80 x10E6/uL   Hemoglobin 16.2 13.0 - 17.7 g/dL   Hematocrit 04.5 40.9 - 51.0 %   MCV 94 79 - 97 fL   MCH 31.6 26.6 - 33.0 pg   MCHC 33.6 31.5 - 35.7 g/dL   RDW 81.1 91.4 - 78.2 %   Platelets 312 150 - 450 x10E3/uL   Neutrophils 52 Not Estab. %   Lymphs 33 Not Estab. %   Monocytes 10 Not Estab. %   Eos 4 Not Estab. %   Basos 1 Not Estab. %   Neutrophils Absolute 4.3 1.4 - 7.0 x10E3/uL   Lymphocytes Absolute 2.7 0.7 - 3.1 x10E3/uL   Monocytes Absolute 0.8 0.1 -  0.9 x10E3/uL   EOS (ABSOLUTE) 0.3 0.0 - 0.4 x10E3/uL   Basophils Absolute 0.1 0.0 - 0.2 x10E3/uL   Immature Granulocytes 0 Not Estab. %   Immature Grans (Abs) 0.0 0.0 - 0.1 x10E3/uL  CMP14+EGFR   Collection Time: 10/03/23  3:35 PM  Result Value Ref Range   Glucose 182 (H) 70 - 99 mg/dL   BUN 9 6 - 24 mg/dL   Creatinine, Ser 9.56 0.76 - 1.27 mg/dL   eGFR 98 >21 HY/QMV/7.84   BUN/Creatinine Ratio 10 9 - 20   Sodium 141 134 - 144 mmol/L   Potassium 4.2 3.5 - 5.2 mmol/L   Chloride 101 96 - 106 mmol/L   CO2 23 20 - 29 mmol/L   Calcium  9.5 8.7 - 10.2 mg/dL   Total Protein 6.8 6.0 - 8.5 g/dL   Albumin 4.2 3.8 - 4.9 g/dL   Globulin, Total 2.6 1.5 - 4.5 g/dL   Bilirubin Total 0.9 0.0 - 1.2 mg/dL   Alkaline Phosphatase 69 44 - 121 IU/L   AST 18 0 - 40 IU/L   ALT 21 0 - 44 IU/L  Vitamin B12   Collection Time: 10/03/23  3:35 PM  Result Value Ref Range   Vitamin B-12 900 232 - 1,245 pg/mL  Thyroid  Panel With TSH   Collection Time: 10/03/23  3:35 PM  Result Value Ref Range   TSH 1.490 0.450 - 4.500 uIU/mL   T4, Total 7.9 4.5 - 12.0 ug/dL   T3 Uptake Ratio 28 24 - 39 %   Free Thyroxine Index 2.2 1.2 - 4.9  PSA, total and free   Collection Time: 10/03/23  3:35 PM  Result Value Ref Range   Prostate Specific Ag, Serum 1.2 0.0 - 4.0 ng/mL   PSA, Free 0.25 N/A ng/mL   PSA, Free Pct 20.8 %       Pertinent labs & imaging results that were available during my care of the patient were reviewed by me and considered in my medical decision making.  Assessment & Plan:  Nain "Zachary Estrada" was seen today for diabetes.  Diagnoses and all orders for this visit:  Type 2 diabetes mellitus with other specified complication, without long-term current use of insulin  (HCC) -     Bayer DCA Hb A1c Waived -     glimepiride  (AMARYL ) 4 MG tablet; Take 2 tablets (8 mg total) by mouth daily with breakfast. -     metFORMIN  (GLUCOPHAGE -XR) 500 MG 24 hr tablet; Take 2 tablets (1,000 mg total) by mouth daily with  breakfast. -     Microalbumin / creatinine urine ratio -     Semaglutide ,0.25  or 0.5MG /DOS, 2 MG/3ML SOPN; Inject 0.25 mg into the skin once a week.  Seasonal allergic rhinitis due to pollen -     levocetirizine (XYZAL ) 5 MG tablet; Take 1 tablet (5 mg total) by mouth every evening.  Hyperlipidemia associated with type 2 diabetes mellitus (HCC) -     Bayer DCA Hb A1c Waived -     Semaglutide ,0.25 or 0.5MG /DOS, 2 MG/3ML SOPN; Inject 0.25 mg into the skin once a week.  Hypertension associated with type 2 diabetes mellitus (HCC) -     Bayer DCA Hb A1c Waived -     triamterene -hydrochlorothiazide  (MAXZIDE -25) 37.5-25 MG tablet; Take 0.5 tablets by mouth daily. -     Semaglutide ,0.25 or 0.5MG /DOS, 2 MG/3ML SOPN; Inject 0.25 mg into the skin once a week.  Uncontrolled type 2 diabetes mellitus with hypoglycemia without coma (HCC) -     Semaglutide ,0.25 or 0.5MG /DOS, 2 MG/3ML SOPN; Inject 0.25 mg into the skin once a week.  Cellulitis and abscess of hand -     doxycycline (VIBRA-TABS) 100 MG tablet; Take 1 tablet (100 mg total) by mouth 2 (two) times daily for 10 days. 1 po bid   Uncontrolled Type 2 Diabetes Mellitus Blood glucose levels fluctuate between 150 mg/dL and 161 mg/dL. A1c is 9.2, indicating poor glycemic control. Currently on metformin  and glimepiride , with dietary indiscretions contributing to glucose spikes. Previously responded well to Ozempic , aiding in weight loss, but insurance coverage was an issue. Plan to attempt reauthorization for Ozempic  or consider Mounjaro, another GLP-1 receptor agonist, if Ozempic  is not covered. - Send prescription for Ozempic  and attempt insurance reauthorization - Consider Mounjaro if Ozempic  is not covered - Check A1c level - Advise to monitor blood glucose levels closely - Discuss dietary modifications to manage blood glucose levels  Diabetic Neuropathy Reports tingling in the hands, consistent with diabetic neuropathy. No new symptoms in the  feet, and denies any sores or numbness in the feet. - Advise to report any new symptoms or changes in sensation  Cellulitis with Abscess Presents with a skin lesion that developed into an abscess with surrounding cellulitis, slow to heal likely due to uncontrolled diabetes. Previous spider bites, including brown recluse and black widow, but specific cause of current lesion is unclear. No known adverse reactions to doxycycline. - Prescribe doxycycline for cellulitis - Instruct to complete the full course of antibiotics - Monitor for any adverse reactions to doxycycline and report if he occurs  Follow-up To reassess diabetes management and response to treatment adjustments. - Schedule follow-up appointment in three months         Continue all other maintenance medications.  Follow up plan: Return in about 3 months (around 08/05/2024) for DM, all labs.   Continue healthy lifestyle choices, including diet (rich in fruits, vegetables, and lean proteins, and low in salt and simple carbohydrates) and exercise (at least 30 minutes of moderate physical activity daily).  Educational handout given for DM  The above assessment and management plan was discussed with the patient. The patient verbalized understanding of and has agreed to the management plan. Patient is aware to call the clinic if they develop any new symptoms or if symptoms persist or worsen. Patient is aware when to return to the clinic for a follow-up visit. Patient educated on when it is appropriate to go to the emergency department.   Kattie Parrot, FNP-C [facility] [phone]

## 2024-05-05 NOTE — Patient Instructions (Addendum)

## 2024-05-06 MED ORDER — TRULICITY 0.75 MG/0.5ML ~~LOC~~ SOAJ: 0.7500 mg | SUBCUTANEOUS | 6 refills | Status: DC

## 2024-05-06 MED ORDER — METFORMIN HCL ER 500 MG PO TB24: 1000.0000 mg | ORAL_TABLET | Freq: Every day | ORAL | 1 refills | Status: DC

## 2024-05-06 NOTE — Addendum Note (Signed)
 Addended by: Galvin Jules on: 05/06/2024 11:19 AM   Modules accepted: Orders

## 2024-05-24 ENCOUNTER — Encounter: Payer: Self-pay | Admitting: Family Medicine

## 2024-05-25 ENCOUNTER — Ambulatory Visit (INDEPENDENT_AMBULATORY_CARE_PROVIDER_SITE_OTHER): Admitting: Orthopedic Surgery

## 2024-05-25 VITALS — BP 131/82 | HR 80 | Ht 69.0 in | Wt 179.0 lb

## 2024-05-25 DIAGNOSIS — M79641 Pain in right hand: Secondary | ICD-10-CM

## 2024-05-26 ENCOUNTER — Encounter: Payer: Self-pay | Admitting: Orthopedic Surgery
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
## 2024-06-22 LAB — HM DIABETES EYE EXAM

## 2024-07-01 ENCOUNTER — Encounter: Payer: Self-pay | Admitting: Family Medicine

## 2024-07-08 ENCOUNTER — Encounter: Payer: Self-pay | Admitting: Family Medicine

## 2024-07-13 ENCOUNTER — Telehealth: Payer: Self-pay | Admitting: Pharmacist

## 2024-07-13 ENCOUNTER — Other Ambulatory Visit: Payer: Self-pay | Admitting: Family Medicine

## 2024-07-13 DIAGNOSIS — E1169 Type 2 diabetes mellitus with other specified complication: Secondary | ICD-10-CM

## 2024-07-13 DIAGNOSIS — E11649 Type 2 diabetes mellitus with hypoglycemia without coma: Secondary | ICD-10-CM

## 2024-07-13 MED ORDER — METFORMIN HCL ER 500 MG PO TB24: 1000.0000 mg | ORAL_TABLET | Freq: Every day | ORAL | 1 refills | Status: AC

## 2024-07-13 NOTE — Addendum Note (Signed)
 Addended by: SEVERA ROCK HERO on: 07/13/2024 01:06 PM   Modules accepted: Orders

## 2024-07-14 ENCOUNTER — Telehealth: Payer: Self-pay | Admitting: Pharmacy Technician

## 2024-07-14 ENCOUNTER — Other Ambulatory Visit (HOSPITAL_COMMUNITY): Payer: Self-pay

## 2024-07-14 NOTE — Telephone Encounter (Signed)
 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 If you need me to send in RX I can--I just dont want patient to get called & confused  He is willing to fill and [facility] pharmacy I will follow up and call patient if you can route me info!  Thank you!

## 2024-07-14 NOTE — Progress Notes (Signed)
   07/14/2024 Name: LIBORIO SACCENTE MRN: [id number] DOB: 05/18/1961  Patient is appearing on a report for [employer] [place] Metric Diabetes and last engaged with the clinical pharmacist to discuss diabetes on 07/13/2024. 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 If you need me to send in RX I can--I just dont want patient to get called & confused  He is willing to fill and [facility] pharmacy I will follow up and call patient if you can route me info!  Thank you!(    Medication Adherence Barriers Identified:  Access issues with any new medication or testing device: Yes Ozempic   Medication Adherence Barriers Addressed/Actions Taken:  Reviewed medication changes per plan from last clinical pharmacist note Medication Access for Ozempic  Will discuss medication access concerns with pharmacist Collaborated with Patient Advocate team regarding test claim Contacted Inocente on the patient Advocate team and she ran test claim for Ozempic  2mg  on patient's insurance and the cot for 1 month supply was $382.26. Obtained coupon card for Ozempic .     Inocente ran the coupon card as secondary to [employer] and it knocked the price down an additional $100. So patient's portion of Ozempic  2mg  for 1 month supply would be $282.26     Responded to PharmD in basket message with the cost of Ozempic .  Next clinical pharmacist appointment is scheduled for: TBD  Kate Caddy, CPhT [facility] Office: [phone] Email: [email or web address]

## 2024-07-17 ENCOUNTER — Other Ambulatory Visit: Payer: Self-pay | Admitting: Family Medicine

## 2024-07-17 DIAGNOSIS — E1169 Type 2 diabetes mellitus with other specified complication: Secondary | ICD-10-CM

## 2024-07-27 ENCOUNTER — Telehealth: Payer: Self-pay

## 2024-07-27 ENCOUNTER — Ambulatory Visit: Admitting: Orthopedic Surgery

## 2024-07-27 NOTE — Progress Notes (Signed)
 Care Guide Pharmacy Note  07/27/2024 Name: Zachary Estrada MRN: [id number] DOB: 04-23-1961  Referred By: Severa Rock HERO, FNP Reason for referral: Complex Care Management (Outreach to schedule with Pharm d )   Zachary Estrada is a 63 y.o. year old male who is a primary care patient of Severa Rock HERO, FNP.  Zachary Estrada was referred to the pharmacist for assistance related to: DMII  Successful contact was made with the patient to discuss pharmacy services including being ready for the pharmacist to call at least 5 minutes before the scheduled appointment time and to have medication bottles and any blood pressure readings ready for review. The patient agreed to meet with the pharmacist via telephone visit on (date/time).08/12/2024  Zachary Estrada , RMA     [place]  [facility] Guide  Direct Dial: [phone]  Website: [email or web address]

## 2024-08-10 ENCOUNTER — Encounter: Payer: Self-pay | Admitting: Family Medicine

## 2024-08-10 ENCOUNTER — Ambulatory Visit: Admitting: Family Medicine

## 2024-08-10 VITALS — BP 128/76 | HR 75 | Temp 98.1°F | Ht 69.0 in | Wt 177.4 lb

## 2024-08-10 DIAGNOSIS — Z6379 Other stressful life events affecting family and household: Secondary | ICD-10-CM | POA: Insufficient documentation

## 2024-08-10 DIAGNOSIS — E785 Hyperlipidemia, unspecified: Secondary | ICD-10-CM

## 2024-08-10 DIAGNOSIS — F339 Major depressive disorder, recurrent, unspecified: Secondary | ICD-10-CM | POA: Diagnosis not present

## 2024-08-10 DIAGNOSIS — F411 Generalized anxiety disorder: Secondary | ICD-10-CM | POA: Insufficient documentation

## 2024-08-10 DIAGNOSIS — G479 Sleep disorder, unspecified: Secondary | ICD-10-CM

## 2024-08-10 DIAGNOSIS — M79641 Pain in right hand: Secondary | ICD-10-CM

## 2024-08-10 DIAGNOSIS — E1169 Type 2 diabetes mellitus with other specified complication: Secondary | ICD-10-CM | POA: Diagnosis not present

## 2024-08-10 DIAGNOSIS — E1159 Type 2 diabetes mellitus with other circulatory complications: Secondary | ICD-10-CM

## 2024-08-10 DIAGNOSIS — I152 Hypertension secondary to endocrine disorders: Secondary | ICD-10-CM

## 2024-08-10 LAB — LIPID PANEL

## 2024-08-10 LAB — BAYER DCA HB A1C WAIVED: HB A1C (BAYER DCA - WAIVED): 7.4 % — ABNORMAL HIGH (ref 4.8–5.6)

## 2024-08-10 MED ORDER — OZEMPIC (0.25 OR 0.5 MG/DOSE) 2 MG/3ML ~~LOC~~ SOPN: 0.5000 mg | PEN_INJECTOR | SUBCUTANEOUS | Status: DC

## 2024-08-10 MED ORDER — GLIMEPIRIDE 4 MG PO TABS: 8.0000 mg | ORAL_TABLET | Freq: Every day | ORAL | 1 refills | Status: AC

## 2024-08-10 MED ORDER — ATORVASTATIN CALCIUM 20 MG PO TABS: 20.0000 mg | ORAL_TABLET | Freq: Every day | ORAL | 1 refills | Status: AC

## 2024-08-10 MED ORDER — CITALOPRAM HYDROBROMIDE 10 MG PO TABS: 10.0000 mg | ORAL_TABLET | Freq: Every day | ORAL | 2 refills | Status: DC

## 2024-08-10 MED ORDER — HYDROXYZINE PAMOATE 25 MG PO CAPS: 25.0000 mg | ORAL_CAPSULE | Freq: Three times a day (TID) | ORAL | 0 refills | Status: DC | PRN

## 2024-08-10 NOTE — Progress Notes (Signed)
 Subjective:  Patient ID: Zachary Estrada, male    DOB: January 14, 1961, 63 y.o.   MRN: [id number]  Patient Care Team: Severa Rock HERO, FNP as PCP - General (Family Medicine) Cleotilde Sewer, OD (Optometry)   Chief Complaint:  Diabetes (3 month follow up ) and Anxiety (When patient tries to sleep at night his mind will not shut off.  Sleeps 3-4 hours per night. )   HPI: Zachary Estrada is a 63 y.o. male presenting on 08/10/2024 for Diabetes (3 month follow up ) and Anxiety (When patient tries to sleep at night his mind will not shut off.  Sleeps 3-4 hours per night. )   Zachary Estrada is a 63 year old male who presents today for management of chronic medical conditions. He complains of anxiety and sleep disturbances.  He is experiencing significant anxiety and sleep disturbances, which he attributes to family stressors, including his niece's suicide attempt and his mother's worsening dementia. He has difficulty sleeping, averaging three to four hours per night, and his mind races when he tries to sleep. He has a history of anxiety during his second marriage but has not been on medication for it recently.  He is managing his diabetes with metformin , glimepiride , and samples of Ozempic . His A1c has improved from 9.4% to 7.4%. His blood sugar levels fluctuate, particularly after consuming sugary foods, and he is working on maintaining a diet that supports his diabetes management. He reports no increased hunger, thirst, or urination when his blood sugar is elevated.  He has a past work-related hand injury from December of last year, for which he underwent twelve weeks of physical therapy. He experiences persistent swelling and limited movement in two fingers, despite being able to make a fist. He completed a course of gabapentin , which alleviated nerve pain, but continues to experience tendon issues.  His mother has aggressive dementia and sundown syndrome, and his stepfather is battling stage  four lung cancer. The family is involved in legal proceedings for guardianship and financial arrangements to support his mother's care. He expresses concern about the impact of these family issues on his mental health and sleep.  His father passed away from B-cell lymphoma cancer three years ago, and he continues to feel the emotional impact of this loss. He describes his father as his best friend and reflects on the difficulty of coping with his absence.           08/10/2024    3:35 PM 05/05/2024    2:33 PM 03/03/2024    2:52 PM 10/03/2023    3:55 PM 06/17/2023    3:15 PM  Depression screen PHQ 2/9  Decreased Interest 0 0 0 0 0  Down, Depressed, Hopeless 3 0 0 0 0  PHQ - 2 Score 3 0 0 0 0  Altered sleeping 3 1 3  0 0  Tired, decreased energy 3 0 2 0 0  Change in appetite 0 0 0 0 0  Feeling bad or failure about yourself  2 0 0 0 0  Trouble concentrating 2 0 0 0 0  Moving slowly or fidgety/restless 0 0 0 0 0  Suicidal thoughts 0 0 0 0 0  PHQ-9 Score 13 1 5  0 0  Difficult doing work/chores Somewhat difficult Not difficult at all Somewhat difficult Not difficult at all       08/10/2024    3:35 PM 05/05/2024    2:33 PM 03/03/2024    2:52 PM 10/03/2023  3:55 PM  GAD 7 : Generalized Anxiety Score  Nervous, Anxious, on Edge 3 0 0 0  Control/stop worrying 3 0 0 0  Worry too much - different things 3 0 0 0  Trouble relaxing 2 0 2 0  Restless 2 0 0 0  Easily annoyed or irritable 2 0 0 0  Afraid - awful might happen 1 0 0 0  Total GAD 7 Score 16 0 2 0  Anxiety Difficulty Somewhat difficult Not difficult at all Somewhat difficult Not difficult at all       Relevant past medical, surgical, family, and social history reviewed and updated as indicated.  Allergies and medications reviewed and updated. Data reviewed: Chart in Epic.   Past Medical History:  Diagnosis Date   Allergy    Coronary artery disease    Diabetes mellitus without complication (HCC)    GERD (gastroesophageal reflux  disease)    Hypertension    Pneumothorax, acute    Raynaud disease     Past Surgical History:  Procedure Laterality Date   BACK SURGERY     PLEURAL SCARIFICATION Left     Social History   Socioeconomic History   Marital status: Single    Spouse name: Not on file   Number of children: Not on file   Years of education: Not on file   Highest education level: 12th grade  Occupational History   Not on file  Tobacco Use   Smoking status: Never   Smokeless tobacco: Never  Substance and Sexual Activity   Alcohol use: Yes   Drug use: Never   Sexual activity: Yes  Other Topics Concern   Not on file  Social History Narrative   Not on file   Social Drivers of Health   Financial Resource Strain: Low Risk  (08/09/2024)   Overall Financial Resource Strain (CARDIA)    Difficulty of Paying Living Expenses: Not hard at all  Food Insecurity: No Food Insecurity (08/09/2024)   Hunger Vital Sign    Worried About Running Out of Food in the Last Year: Never true    Ran Out of Food in the Last Year: Never true  Transportation Needs: No Transportation Needs (08/09/2024)   PRAPARE - Administrator, Civil Service (Medical): No    Lack of Transportation (Non-Medical): No  Physical Activity: Insufficiently Active (08/09/2024)   Exercise Vital Sign    Days of Exercise per Week: 5 days    Minutes of Exercise per Session: 10 min  Stress: Stress Concern Present (08/09/2024)   [employer] of Occupational Health - Occupational Stress Questionnaire    Feeling of Stress: Very much  Social Connections: Socially Isolated (08/09/2024)   Social Connection and Isolation Panel    Frequency of Communication with Friends and Family: More than three times a week    Frequency of Social Gatherings with Friends and Family: Once a week    Attends Religious Services: Never    Database administrator or Organizations: No    Attends Engineer, structural: Not on file    Marital Status: Divorced   Intimate Partner Violence: Unknown (03/07/2022)   Received from [facility]   HITS    Physically Hurt: Not on file    Insult or Talk Down To: Not on file    Threaten Physical Harm: Not on file    Scream or Curse: Not on file    Outpatient Encounter Medications as of 08/10/2024  Medication Sig   aspirin   EC 81 MG tablet Take 81 mg by mouth daily. Swallow whole.   citalopram  (CELEXA ) 10 MG tablet Take 1 tablet (10 mg total) by mouth daily.   fish oil-omega-3 fatty acids 1000 MG capsule Take 1 g by mouth daily.   fluticasone  (FLONASE ) 50 MCG/ACT nasal spray SPRAY 1 SPRAY INTO BOTH NOSTRILS DAILY.   glucose blood test strip One Touch Ultra strips. Check blood sugar 4 times daily   hydrOXYzine  (VISTARIL ) 25 MG capsule Take 1 capsule (25 mg total) by mouth every 8 (eight) hours as needed for anxiety (insomnia).   levocetirizine (XYZAL ) 5 MG tablet Take 1 tablet (5 mg total) by mouth every evening.   metFORMIN  (GLUCOPHAGE -XR) 500 MG 24 hr tablet Take 2 tablets (1,000 mg total) by mouth daily with breakfast.   Multiple Vitamins-Minerals (ONE-A-DAY MENS 50+ ADVANTAGE PO) Take 1 tablet by mouth daily.   Semaglutide ,0.25 or 0.5MG /DOS, (OZEMPIC , 0.25 OR 0.5 MG/DOSE,) 2 MG/3ML SOPN Inject 0.5 mg into the skin once a week.   triamterene -hydrochlorothiazide  (MAXZIDE -25) 37.5-25 MG tablet Take 0.5 tablets by mouth daily.   atorvastatin  (LIPITOR) 20 MG tablet Take 1 tablet (20 mg total) by mouth daily.   glimepiride  (AMARYL ) 4 MG tablet Take 2 tablets (8 mg total) by mouth daily with breakfast.   [DISCONTINUED] atorvastatin  (LIPITOR) 20 MG tablet TAKE 1 TABLET BY MOUTH EVERY DAY   [DISCONTINUED] Dulaglutide  (TRULICITY ) 0.75 MG/0.5ML SOAJ Inject 0.75 mg into the skin once a week for 28 doses.   [DISCONTINUED] gabapentin  (NEURONTIN ) 100 MG capsule TAKE 1 CAPSULE (100 MG TOTAL) BY MOUTH THREE TIMES DAILY.   [DISCONTINUED] glimepiride  (AMARYL ) 4 MG tablet Take 2 tablets (8 mg total) by mouth daily with  breakfast.   No facility-administered encounter medications on file as of 08/10/2024.    Allergies  Allergen Reactions   Cephalexin Other (See Comments)   Invokamet [Canagliflozin-Metformin  Hcl]     Pertinent ROS per HPI, otherwise unremarkable      Objective:  BP 128/76   Pulse 75   Temp 98.1 F (36.7 C)   Ht 5' 9 (1.753 m)   Wt 177 lb 6.4 oz (80.5 kg)   SpO2 97%   BMI 26.20 kg/m    Wt Readings from Last 3 Encounters:  08/10/24 177 lb 6.4 oz (80.5 kg)  05/25/24 179 lb (81.2 kg)  05/05/24 179 lb 6.4 oz (81.4 kg)    Physical Exam Vitals and nursing note reviewed.  Constitutional:      Appearance: Normal appearance. He is overweight.  HENT:     Head: Normocephalic and atraumatic.     Mouth/Throat:     Mouth: Mucous membranes are moist.  Eyes:     Pupils: Pupils are equal, round, and reactive to light.  Cardiovascular:     Rate and Rhythm: Normal rate and regular rhythm.     Heart sounds: Normal heart sounds.  Pulmonary:     Effort: Pulmonary effort is normal.     Breath sounds: Normal breath sounds.  Musculoskeletal:     Cervical back: Neck supple.     Right lower leg: No edema.     Left lower leg: No edema.  Skin:    General: Skin is warm and dry.     Capillary Refill: Capillary refill takes less than 2 seconds.  Neurological:     General: No focal deficit present.     Mental Status: He is alert and oriented to person, place, and time.  Psychiatric:  Mood and Affect: Mood normal.        Behavior: Behavior normal.        Thought Content: Thought content normal.        Judgment: Judgment normal.     Results for orders placed or performed in visit on 06/22/24  HM DIABETES EYE EXAM   Collection Time: 06/22/24  4:28 PM  Result Value Ref Range   HM Diabetic Eye Exam No Retinopathy No Retinopathy       Pertinent labs & imaging results that were available during my care of the patient were reviewed by me and considered in my medical decision  making.  Assessment & Plan:  Toris Laverdiere was seen today for diabetes and anxiety.  Diagnoses and all orders for this visit:  Type 2 diabetes mellitus with other specified complication, without long-term current use of insulin  (HCC) -     Bayer DCA Hb A1c Waived -     CMP14+EGFR -     Lipid panel -     CBC with Differential/Platelet -     Thyroid  Panel With TSH -     glimepiride  (AMARYL ) 4 MG tablet; Take 2 tablets (8 mg total) by mouth daily with breakfast. -     atorvastatin  (LIPITOR) 20 MG tablet; Take 1 tablet (20 mg total) by mouth daily. -     Microalbumin / creatinine urine ratio -     Semaglutide ,0.25 or 0.5MG /DOS, (OZEMPIC , 0.25 OR 0.5 MG/DOSE,) 2 MG/3ML SOPN; Inject 0.5 mg into the skin once a week.  Hyperlipidemia associated with type 2 diabetes mellitus (HCC) -     CMP14+EGFR -     Lipid panel -     CBC with Differential/Platelet -     atorvastatin  (LIPITOR) 20 MG tablet; Take 1 tablet (20 mg total) by mouth daily.  Hypertension associated with type 2 diabetes mellitus (HCC) -     CMP14+EGFR -     Lipid panel -     CBC with Differential/Platelet -     Thyroid  Panel With TSH -     Microalbumin / creatinine urine ratio  Generalized anxiety disorder -     CMP14+EGFR -     CBC with Differential/Platelet -     Thyroid  Panel With TSH -     citalopram  (CELEXA ) 10 MG tablet; Take 1 tablet (10 mg total) by mouth daily. -     hydrOXYzine  (VISTARIL ) 25 MG capsule; Take 1 capsule (25 mg total) by mouth every 8 (eight) hours as needed for anxiety (insomnia).  Depression, recurrent (HCC) -     CMP14+EGFR -     CBC with Differential/Platelet -     Thyroid  Panel With TSH -     citalopram  (CELEXA ) 10 MG tablet; Take 1 tablet (10 mg total) by mouth daily. -     hydrOXYzine  (VISTARIL ) 25 MG capsule; Take 1 capsule (25 mg total) by mouth every 8 (eight) hours as needed for anxiety (insomnia).  Stress due to illness of family member -     CMP14+EGFR -     CBC with  Differential/Platelet -     Thyroid  Panel With TSH -     citalopram  (CELEXA ) 10 MG tablet; Take 1 tablet (10 mg total) by mouth daily. -     hydrOXYzine  (VISTARIL ) 25 MG capsule; Take 1 capsule (25 mg total) by mouth every 8 (eight) hours as needed for anxiety (insomnia).  Sleep disorder -     citalopram  (CELEXA ) 10 MG tablet; Take  1 tablet (10 mg total) by mouth daily. -     hydrOXYzine  (VISTARIL ) 25 MG capsule; Take 1 capsule (25 mg total) by mouth every 8 (eight) hours as needed for anxiety (insomnia).  Pain in right hand Followed by ortho       Type 2 diabetes mellitus with circulatory and other specified complications Type 2 diabetes mellitus with improved glycemic control. Hemoglobin A1c has decreased from 9.4% to 7.4%. Occasional hyperglycemia, particularly after consuming high-sugar foods. No increased hunger, thirst, or urination reported. Blood pressure is well-managed. Blood sugar control is improved but occasional spikes occur.  - Continue metformin  and glimepiride . - Continue Ozempic  at 0.5 mg dose. [facility] will call on Thursday to discuss diabetes management.  Generalized anxiety disorder with insomnia Generalized anxiety disorder with significant insomnia and anxiety symptoms. Reports difficulty sleeping, with racing thoughts and anxiety impacting sleep quality. Engaged to a supportive partner who assists with diabetes management. Brother is taking Celexa  for anxiety. No prior use of anxiety medications reported. Hydroxyzine  prescribed for acute anxiety or to aid sleep, with potential side effect of sleepiness. - Start Celexa , take daily, preferably at night. - Prescribe hydroxyzine  25 mg as needed for acute anxiety or to aid sleep. - Schedule follow-up appointment in six weeks to evaluate the effectiveness of Celexa . - Advise to try hydroxyzine  on a weekend to assess response.  Status post right hand injury with persistent swelling and limited motion Status post right hand  injury with persistent swelling and limited motion, particularly in two fingers. Completed 12 weeks of physical therapy. No significant nerve pain currently, but previous nerve pain was managed with gabapentin . Swelling persists despite therapy. - Attend appointment with Dr. Onesimo for further evaluation of hand swelling and limited motion. - Continue home exercises including hot and cold water treatments for lymphatic drainage.          Continue all other maintenance medications.  Follow up plan: Return in about 8 weeks (around 10/05/2024), or if symptoms worsen or fail to improve, for Anxiety.   Continue healthy lifestyle choices, including diet (rich in fruits, vegetables, and lean proteins, and low in salt and simple carbohydrates) and exercise (at least 30 minutes of moderate physical activity daily).  Educational handout given for DM, anxiety  The above assessment and management plan was discussed with the patient. The patient verbalized understanding of and has agreed to the management plan. Patient is aware to call the clinic if they develop any new symptoms or if symptoms persist or worsen. Patient is aware when to return to the clinic for a follow-up visit. Patient educated on when it is appropriate to go to the emergency department.   Rosaline Bruns, FNP-C [facility] [phone]

## 2024-08-10 NOTE — Patient Instructions (Addendum)

## 2024-08-11 ENCOUNTER — Ambulatory Visit (INDEPENDENT_AMBULATORY_CARE_PROVIDER_SITE_OTHER): Admitting: Orthopedic Surgery

## 2024-08-11 ENCOUNTER — Encounter: Payer: Self-pay | Admitting: Orthopedic Surgery

## 2024-08-11 ENCOUNTER — Ambulatory Visit: Payer: Self-pay | Admitting: Family Medicine

## 2024-08-11 VITALS — BP 128/76 | Ht 69.0 in | Wt 175.0 lb

## 2024-08-11 DIAGNOSIS — M79641 Pain in right hand: Secondary | ICD-10-CM

## 2024-08-11 LAB — CBC WITH DIFFERENTIAL/PLATELET
Basophils Absolute: 0.1 x10E3/uL (ref 0.0–0.2)
Basos: 1 %
EOS (ABSOLUTE): 0.3 x10E3/uL (ref 0.0–0.4)
Eos: 3 %
Hematocrit: 45.6 % (ref 37.5–51.0)
Hemoglobin: 15.8 g/dL (ref 13.0–17.7)
Immature Grans (Abs): 0 x10E3/uL (ref 0.0–0.1)
Immature Granulocytes: 0 %
Lymphocytes Absolute: 3 x10E3/uL (ref 0.7–3.1)
Lymphs: 32 %
MCH: 33 pg (ref 26.6–33.0)
MCHC: 34.6 g/dL (ref 31.5–35.7)
MCV: 95 fL (ref 79–97)
Monocytes Absolute: 0.9 x10E3/uL (ref 0.1–0.9)
Monocytes: 10 %
Neutrophils Absolute: 5.1 x10E3/uL (ref 1.4–7.0)
Neutrophils: 54 %
Platelets: 321 x10E3/uL (ref 150–450)
RBC: 4.79 x10E6/uL (ref 4.14–5.80)
RDW: 12.5 % (ref 11.6–15.4)
WBC: 9.4 x10E3/uL (ref 3.4–10.8)

## 2024-08-11 LAB — THYROID PANEL WITH TSH
Free Thyroxine Index: 2.3 (ref 1.2–4.9)
T3 Uptake Ratio: 29 (ref 24–39)
T4, Total: 8 ug/dL (ref 4.5–12.0)
TSH: 1.27 u[IU]/mL (ref 0.450–4.500)

## 2024-08-11 LAB — CMP14+EGFR
ALT: 18 IU/L (ref 0–44)
AST: 15 IU/L (ref 0–40)
Albumin: 4.7 g/dL (ref 3.8–4.9)
Alkaline Phosphatase: 51 IU/L (ref 44–121)
BUN/Creatinine Ratio: 18 (ref 10–24)
BUN: 13 mg/dL (ref 8–27)
Bilirubin Total: 0.5 mg/dL (ref 0.0–1.2)
CO2: 21 mmol/L (ref 20–29)
Calcium: 9.4 mg/dL (ref 8.6–10.2)
Chloride: 102 mmol/L (ref 96–106)
Creatinine, Ser: 0.74 mg/dL — AB (ref 0.76–1.27)
Globulin, Total: 1.9 g/dL (ref 1.5–4.5)
Glucose: 60 mg/dL — AB (ref 70–99)
Potassium: 4 mmol/L (ref 3.5–5.2)
Sodium: 139 mmol/L (ref 134–144)
Total Protein: 6.6 g/dL (ref 6.0–8.5)
eGFR: 104 mL/min/1.73 (ref >59)

## 2024-08-11 LAB — LIPID PANEL
Cholesterol, Total: 116 mg/dL (ref 100–199)
HDL: 30 mg/dL — AB (ref >39)
LDL CALC COMMENT:: 3.9 ratio (ref 0.0–5.0)
LDL Chol Calc (NIH): 58 mg/dL (ref 0–99)
Triglycerides: 165 mg/dL — AB (ref 0–149)
VLDL Cholesterol Cal: 28 mg/dL (ref 5–40)

## 2024-08-12 ENCOUNTER — Other Ambulatory Visit (INDEPENDENT_AMBULATORY_CARE_PROVIDER_SITE_OTHER)

## 2024-08-12 DIAGNOSIS — E119 Type 2 diabetes mellitus without complications: Secondary | ICD-10-CM

## 2024-08-12 DIAGNOSIS — Z7985 Long-term (current) use of injectable non-insulin antidiabetic drugs: Secondary | ICD-10-CM
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
## 2024-08-12 NOTE — Progress Notes (Signed)
 08/12/2024 Name: Zachary Estrada MRN: [id number] DOB: Sep 15, 1961  Chief Complaint  Patient presents with   Diabetes    Zachary Estrada is a 63 y.o. year old male who presented for a telephone visit.   They were referred to the pharmacist by their PCP for assistance in managing diabetes and medication access.    Subjective:  Care Team: Primary Care Provider: Severa Rock HERO, FNP ; Next Scheduled Visit: 09/30/24   Medication Access/Adherence  Current Pharmacy:  [facility] [phone] - [place], [place] - [address] [place] [address] Phone: [phone] Fax: [phone]   Patient reports affordability concerns with their medications: Yes --Trulicity  was $800/month Patient reports access/transportation concerns to their pharmacy: No  Patient reports adherence concerns with their medications:  No     Diabetes:  Current medications: Ozempic  0.5mg  weekly, metformin  (total 2 pills daily), glimepiride  Medications tried in the past: Trulicity  stopped due to cost; Invokamet (sepsis)  Current glucose readings: 80-90s  Patient denies hypoglycemic s/sx including dizziness, shakiness, sweating. Patient denies hyperglycemic symptoms including polyuria, polydipsia, polyphagia, nocturia, neuropathy, blurred vision.  Current meal patterns:  The patient is asked to make an attempt to improve diet and exercise patterns to aid in medical management of this problem. Discussed meal planning options and Plate method for healthy eating Avoid sugary drinks and desserts Incorporate balanced protein, non starchy veggies, 1 serving of carbohydrate with each meal Increase water intake Increase physical activity as able  - Snacks sugar free pudding cups   Current physical activity: encouraged as able   Macrovascular and Microvascular Risk Reduction:  Statin? yes (atorva); ACEi/ARB? no; no history of therapy but is indicated Last urinary albumin/creatinine  ratio:  Lab Results  Component Value Date   MICRALBCREAT 4 12/13/2022   MICRALBCREAT 5 12/04/2021   Last eye exam:  Lab Results  Component Value Date   HMDIABEYEEXA No Retinopathy 06/22/2024   Last foot exam: 05/05/2024 Tobacco Use:  Tobacco Use: Low Risk  (08/11/2024)   Patient History    Smoking Tobacco Use: Never    Smokeless Tobacco Use: Never    Passive Exposure: Not on file     Objective:  Lab Results  Component Value Date   HGBA1C 7.4 (H) 08/10/2024    Lab Results  Component Value Date   CREATININE 0.74 (L) 08/10/2024   BUN 13 08/10/2024   NA 139 08/10/2024   K 4.0 08/10/2024   CL 102 08/10/2024   CO2 21 08/10/2024    Lab Results  Component Value Date   CHOL 116 08/10/2024   HDL 30 (L) 08/10/2024   LDLCALC 58 08/10/2024   TRIG 165 (H) 08/10/2024   CHOLHDL 3.9 08/10/2024    Medications Reviewed Today     Reviewed by Billee Mliss BIRCH, [facility] (Pharmacist) on 08/12/24 at 1428  Med List Status: <None>   Medication Order Taking? Sig Documenting Provider Last Dose Status Informant  aspirin  EC 81 MG tablet [id number]  Take 81 mg by mouth daily. Swallow whole. [provider]  Active   atorvastatin  (LIPITOR) 20 MG tablet [id number]  Take 1 tablet (20 mg total) by mouth daily. Severa Rock HERO, FNP  Active   citalopram  (CELEXA ) 10 MG tablet [id number]  Take 1 tablet (10 mg total) by mouth daily. Severa Rock HERO, FNP  Active   fish oil-omega-3 fatty acids 1000 MG capsule [id number]  Take 1 g by mouth daily. [provider]  Active Self  fluticasone  (FLONASE ) 50 MCG/ACT  nasal spray [id number]  SPRAY 1 SPRAY INTO BOTH NOSTRILS DAILY. Severa Rock HERO, FNP  Active   glimepiride  (AMARYL ) 4 MG tablet [id number]  Take 2 tablets (8 mg total) by mouth daily with breakfast. Severa Rock HERO, FNP  Active   glucose blood test strip [id number]  One Touch Ultra strips. Check blood sugar 4 times daily Rakes, Rock HERO, FNP  Active   hydrOXYzine  (VISTARIL ) 25 MG capsule [id number]   Take 1 capsule (25 mg total) by mouth every 8 (eight) hours as needed for anxiety (insomnia). Severa Rock HERO, FNP  Active   levocetirizine (XYZAL ) 5 MG tablet [id number]  Take 1 tablet (5 mg total) by mouth every evening. Severa Rock HERO, FNP  Active   metFORMIN  (GLUCOPHAGE -XR) 500 MG 24 hr tablet [id number]  Take 2 tablets (1,000 mg total) by mouth daily with breakfast. Severa Rock HERO, FNP  Active   Multiple Vitamins-Minerals (ONE-A-DAY MENS 50+ ADVANTAGE PO) [id number]  Take 1 tablet by mouth daily. [provider]  Active Self  Semaglutide ,0.25 or 0.5MG /DOS, (OZEMPIC , 0.25 OR 0.5 MG/DOSE,) 2 MG/3ML SOPN [id number]  Inject 0.5 mg into the skin once a week. Severa Rock HERO, FNP  Active   triamterene -hydrochlorothiazide  (MAXZIDE -25) 37.5-25 MG tablet [id number]  Take 0.5 tablets by mouth daily. Severa Rock HERO, FNP  Active               Assessment/Plan:   Diabetes: - Currently uncontrolled; goal A1c <7%. Cardiorenal risk reduction is optimized.. Blood pressure is at goal <130/80. LDL is at goal.  - Reviewed long term cardiovascular and renal outcomes of uncontrolled blood sugar., Reviewed goal A1c, goal fasting, and goal 2 hour post prandial glucose. Recommended to check glucose FBG or if symptomatic, Reviewed dietary modifications including healthy plate method., and Reviewed lifestyle modifications including increased physical activity . - Recommend to continue Ozempic  0.5mg  , metformin  & glimepiride  for now. - Patient denies personal or family history of multiple endocrine neoplasia type 2, medullary thyroid  cancer; personal history of pancreatitis or gallbladder disease., Discussed side effects of gastrointestinal upset/nausea; eating smaller meals, avoiding high-fat foods, and remaining upright after eating may reduce nausea. Discussed that overeating is a major trigger of nausea with this class of medications, as often times patients will start to feel full sooner and may need to decrease  portion sizes from what they were previously accustomed to.  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

## 2024-09-09 ENCOUNTER — Other Ambulatory Visit: Payer: Self-pay | Admitting: *Deleted

## 2024-09-09 MED ORDER — FREESTYLE LANCETS MISC: 3 refills | Status: DC

## 2024-09-09 MED ORDER — FREESTYLE FREEDOM LITE W/DEVICE KIT: PACK | 0 refills | Status: DC

## 2024-09-09 MED ORDER — FREESTYLE LITE TEST VI STRP: ORAL_STRIP | 3 refills | Status: DC

## 2024-09-30 ENCOUNTER — Encounter: Payer: Self-pay | Admitting: Family Medicine

## 2024-09-30 ENCOUNTER — Ambulatory Visit (INDEPENDENT_AMBULATORY_CARE_PROVIDER_SITE_OTHER): Admitting: Family Medicine

## 2024-09-30 VITALS — BP 137/81 | HR 61 | Temp 97.0°F | Ht 69.0 in | Wt 176.4 lb

## 2024-09-30 DIAGNOSIS — G479 Sleep disorder, unspecified: Secondary | ICD-10-CM | POA: Diagnosis not present

## 2024-09-30 DIAGNOSIS — E1169 Type 2 diabetes mellitus with other specified complication: Secondary | ICD-10-CM

## 2024-09-30 DIAGNOSIS — F321 Major depressive disorder, single episode, moderate: Secondary | ICD-10-CM | POA: Insufficient documentation

## 2024-09-30 DIAGNOSIS — Z7985 Long-term (current) use of injectable non-insulin antidiabetic drugs: Secondary | ICD-10-CM

## 2024-09-30 DIAGNOSIS — L720 Epidermal cyst: Secondary | ICD-10-CM

## 2024-09-30 DIAGNOSIS — F411 Generalized anxiety disorder: Secondary | ICD-10-CM | POA: Diagnosis not present

## 2024-09-30 MED ORDER — HYDROXYZINE PAMOATE 25 MG PO CAPS
25.0000 mg | ORAL_CAPSULE | Freq: Three times a day (TID) | ORAL | 1 refills | Status: DC | PRN
Start: 2024-09-30 — End: 2024-10-14

## 2024-09-30 MED ORDER — CITALOPRAM HYDROBROMIDE 20 MG PO TABS: 20.0000 mg | ORAL_TABLET | Freq: Every day | ORAL | 1 refills | Status: AC

## 2024-09-30 NOTE — Progress Notes (Signed)
 Subjective:  Patient ID: Zachary Estrada, male    DOB: September 25, 1961, 63 y.o.   MRN: [id number]  Patient Care Team: Severa Rock HERO, FNP as PCP - General (Family Medicine) Cleotilde Sewer, OD (Optometry)   Chief Complaint:  Anxiety (8 week follow up )   HPI: Zachary Estrada is a 63 y.o. male presenting on 09/30/2024 for Anxiety (8 week follow up )   Zachary Estrada is a 63 year old male who presents with a cyst on his back and follow-up for anxiety management.  Cutaneous cyst - New cyst located on the right shoulder blade, first noticed during a hug with his fiance - Cyst is somewhat bothersome but less painful than a previous cyst treated a couple of years ago by another physician  Anxiety and sleep disturbance - Currently treated with Celexa  10 mg, recently started, with perceived improvement in mood and affect - Others have observed him being more upbeat since starting Celexa  - Uses hydroxyzine  as needed, primarily on weekends, due to sedative effects; describes it as a 'dream pill' that helps with sleep - Avoids hydroxyzine  during the workweek to prevent daytime sedation - Ongoing anxiety related to family stress, including recent death of his mother from dementia and Alzheimer's at age 31 and concerns about his stepfather with stage four lung cancer - Difficulty falling and staying asleep during the workweek, attributed to family-related stress  Glycemic control - Managing blood glucose with Ozempic ; blood sugar levels mostly stable with occasional elevations - Uses [place] orange juice to correct episodes of hypoglycemia - Fiance assists with meal preparation, focusing on low cholesterol and reduced fried foods          Relevant past medical, surgical, family, and social history reviewed and updated as indicated.  Allergies and medications reviewed and updated. Data reviewed: Chart in Epic.   Past Medical History:  Diagnosis Date   Allergy    Coronary  artery disease    Diabetes mellitus without complication (HCC)    GERD (gastroesophageal reflux disease)    Hypertension    Pneumothorax, acute    Raynaud disease     Past Surgical History:  Procedure Laterality Date   BACK SURGERY     PLEURAL SCARIFICATION Left     Social History   Socioeconomic History   Marital status: Single    Spouse name: Not on file   Number of children: Not on file   Years of education: Not on file   Highest education level: 12th grade  Occupational History   Not on file  Tobacco Use   Smoking status: Never   Smokeless tobacco: Never  Substance and Sexual Activity   Alcohol use: Yes   Drug use: Never   Sexual activity: Yes  Other Topics Concern   Not on file  Social History Narrative   Not on file   Social Drivers of Health   Financial Resource Strain: Medium Risk (09/29/2024)   Overall Financial Resource Strain (CARDIA)    Difficulty of Paying Living Expenses: Somewhat hard  Food Insecurity: No Food Insecurity (09/29/2024)   Hunger Vital Sign    Worried About Running Out of Food in the Last Year: Never true    Ran Out of Food in the Last Year: Never true  Transportation Needs: No Transportation Needs (09/29/2024)   PRAPARE - Administrator, Civil Service (Medical): No    Lack of Transportation (Non-Medical): No  Physical Activity: Insufficiently Active (09/29/2024)  Exercise Vital Sign    Days of Exercise per Week: 3 days    Minutes of Exercise per Session: 30 min  Stress: Stress Concern Present (09/29/2024)   [employer] of Occupational Health - Occupational Stress Questionnaire    Feeling of Stress: Rather much  Social Connections: Moderately Integrated (09/29/2024)   Social Connection and Isolation Panel    Frequency of Communication with Friends and Family: More than three times a week    Frequency of Social Gatherings with Friends and Family: Once a week    Attends Religious Services: 1 to 4 times per year     Active Member of [employer] or Organizations: No    Attends Engineer, Structural: Not on file    Marital Status: Living with partner  Recent Concern: Social Connections - Socially Isolated (08/09/2024)   Social Connection and Isolation Panel    Frequency of Communication with Friends and Family: More than three times a week    Frequency of Social Gatherings with Friends and Family: Once a week    Attends Religious Services: Never    Database Administrator or Organizations: No    Attends Engineer, Structural: Not on file    Marital Status: Divorced  Intimate Partner Violence: Unknown (03/07/2022)   Received from [facility]   HITS    Physically Hurt: Not on file    Insult or Talk Down To: Not on file    Threaten Physical Harm: Not on file    Scream or Curse: Not on file    Outpatient Encounter Medications as of 09/30/2024  Medication Sig   aspirin  EC 81 MG tablet Take 81 mg by mouth daily. Swallow whole.   atorvastatin  (LIPITOR) 20 MG tablet Take 1 tablet (20 mg total) by mouth daily.   Blood Glucose Monitoring Suppl (FREESTYLE FREEDOM LITE) w/Device KIT Test BS in the morning, at noon and at bedtime Dx E11.69   citalopram  (CELEXA ) 20 MG tablet Take 1 tablet (20 mg total) by mouth daily.   fish oil-omega-3 fatty acids 1000 MG capsule Take 1 g by mouth daily.   fluticasone  (FLONASE ) 50 MCG/ACT nasal spray SPRAY 1 SPRAY INTO BOTH NOSTRILS DAILY.   glimepiride  (AMARYL ) 4 MG tablet Take 2 tablets (8 mg total) by mouth daily with breakfast.   glucose blood (FREESTYLE LITE) test strip Test BS in the morning, at noon and at bedtime Dx E11.69   Lancets (FREESTYLE) lancets Test BS in the morning, at noon and at bedtime Dx E11.69   levocetirizine (XYZAL ) 5 MG tablet Take 1 tablet (5 mg total) by mouth every evening.   metFORMIN  (GLUCOPHAGE -XR) 500 MG 24 hr tablet Take 2 tablets (1,000 mg total) by mouth daily with breakfast.   Multiple Vitamins-Minerals (ONE-A-DAY MENS 50+ ADVANTAGE  PO) Take 1 tablet by mouth daily.   Semaglutide ,0.25 or 0.5MG /DOS, (OZEMPIC , 0.25 OR 0.5 MG/DOSE,) 2 MG/3ML SOPN Inject 0.5 mg into the skin once a week.   triamterene -hydrochlorothiazide  (MAXZIDE -25) 37.5-25 MG tablet Take 0.5 tablets by mouth daily.   [DISCONTINUED] citalopram  (CELEXA ) 10 MG tablet Take 1 tablet (10 mg total) by mouth daily.   [DISCONTINUED] hydrOXYzine  (VISTARIL ) 25 MG capsule Take 1 capsule (25 mg total) by mouth every 8 (eight) hours as needed for anxiety (insomnia).   hydrOXYzine  (VISTARIL ) 25 MG capsule Take 1 capsule (25 mg total) by mouth every 8 (eight) hours as needed for anxiety (insomnia).   No facility-administered encounter medications on file as of 09/30/2024.  Allergies  Allergen Reactions   Cephalexin Other (See Comments)   Invokamet [Canagliflozin-Metformin  Hcl]     Per notes/pt report-->severe reaction to Invokamet in the past, which resulted in a two-week hospital stay due to sepsis.    Pertinent ROS per HPI, otherwise unremarkable      Objective:  BP 137/81   Pulse 61   Temp (!) 97 F (36.1 C)   Ht 5' 9 (1.753 m)   Wt 176 lb 6.4 oz (80 kg)   SpO2 98%   BMI 26.05 kg/m    Wt Readings from Last 3 Encounters:  09/30/24 176 lb 6.4 oz (80 kg)  08/11/24 175 lb (79.4 kg)  08/10/24 177 lb 6.4 oz (80.5 kg)    Physical Exam Vitals and nursing note reviewed.  Constitutional:      General: He is not in acute distress.    Appearance: Normal appearance. He is well-developed and well-groomed. He is not ill-appearing, toxic-appearing or diaphoretic.  HENT:     Head: Normocephalic and atraumatic.     Jaw: There is normal jaw occlusion.     Right Ear: Hearing normal.     Left Ear: Hearing normal.     Nose: Nose normal.     Mouth/Throat:     Lips: Pink.     Mouth: Mucous membranes are moist.     Pharynx: Uvula midline.  Eyes:     General: Lids are normal.     Conjunctiva/sclera: Conjunctivae normal.     Pupils: Pupils are equal, round, and  reactive to light.  Neck:     Thyroid : No thyroid  mass, thyromegaly or thyroid  tenderness.     Vascular: No JVD.     Trachea: Trachea and phonation normal.  Cardiovascular:     Rate and Rhythm: Normal rate and regular rhythm.     Chest Wall: PMI is not displaced.     Pulses: Normal pulses.     Heart sounds: Normal heart sounds. No murmur heard.    No friction rub. No gallop.  Pulmonary:     Effort: Pulmonary effort is normal. No respiratory distress.     Breath sounds: Normal breath sounds. No wheezing.  Abdominal:     General: There is no abdominal bruit.     Palpations: There is no hepatomegaly or splenomegaly.     Tenderness: There is no right CVA tenderness or left CVA tenderness.  Musculoskeletal:        General: Normal range of motion.     Cervical back: Normal range of motion and neck supple.  Skin:    General: Skin is warm and dry.     Capillary Refill: Capillary refill takes less than 2 seconds.     Coloration: Skin is not cyanotic, jaundiced or pale.      Neurological:     General: No focal deficit present.     Mental Status: He is alert and oriented to person, place, and time.     Sensory: Sensation is intact.     Motor: Motor function is intact.     Coordination: Coordination is intact.     Gait: Gait is intact.     Deep Tendon Reflexes: Reflexes are normal and symmetric.  Psychiatric:        Attention and Perception: Attention and perception normal.        Mood and Affect: Mood and affect normal.        Speech: Speech normal.        Behavior: Behavior normal.  Behavior is cooperative.        Thought Content: Thought content normal.        Cognition and Memory: Cognition and memory normal.        Judgment: Judgment normal.    I & D  Date/Time: 09/30/2024 4:20 PM  Performed by: Severa Rock HERO, FNP Authorized by: Severa Rock HERO, FNP   Consent:    Consent obtained:  Written   Consent given by:  Patient   Risks, benefits, and alternatives were discussed:  yes     Risks discussed:  Bleeding, incomplete drainage, pain, infection and damage to other organs   Alternatives discussed:  No treatment, delayed treatment, alternative treatment, observation and referral Universal protocol:    Procedure explained and questions answered to patient or proxy's satisfaction: yes     Relevant documents present and verified: yes     Immediately prior to procedure a time out was called: yes     Patient identity confirmed:  Verbally with patient Location:    Type:  Cyst   Size:  2 cm   Location:  Trunk   Trunk location:  Back (right upper back) Sedation:    Sedation type:  None Anesthesia:    Anesthesia method:  Local infiltration   Local anesthetic:  Lidocaine 2% w/o epi Procedure type:    Complexity:  Complex Procedure details:    Needle aspiration: no     Incision types:  Single straight   Incision depth:  Submucosal   Scalpel blade:  11   Wound management:  Irrigated with saline, extensive cleaning and debrided (sac removed)   Drainage characteristics: thick and white.   Drainage amount:  Moderate   Wound treatment: 4.0 ethilon suture placed - 1.   Packing materials:  None Post-procedure details:    Procedure completion:  Tolerated well, no immediate complications    Results for orders placed or performed in visit on 08/10/24  Bayer DCA Hb A1c Waived   Collection Time: 08/10/24  3:23 PM  Result Value Ref Range   HB A1C (BAYER DCA - WAIVED) 7.4 (H) 4.8 - 5.6 %  CMP14+EGFR   Collection Time: 08/10/24  3:28 PM  Result Value Ref Range   Glucose 60 (L) 70 - 99 mg/dL   BUN 13 8 - 27 mg/dL   Creatinine, Ser 9.25 (L) 0.76 - 1.27 mg/dL   eGFR 895 >40 fO/fpw/8.26   BUN/Creatinine Ratio 18 10 - 24   Sodium 139 134 - 144 mmol/L   Potassium 4.0 3.5 - 5.2 mmol/L   Chloride 102 96 - 106 mmol/L   CO2 21 20 - 29 mmol/L   Calcium  9.4 8.6 - 10.2 mg/dL   Total Protein 6.6 6.0 - 8.5 g/dL   Albumin 4.7 3.8 - 4.9 g/dL   Globulin, Total 1.9 1.5 - 4.5  g/dL   Bilirubin Total 0.5 0.0 - 1.2 mg/dL   Alkaline Phosphatase 51 44 - 121 IU/L   AST 15 0 - 40 IU/L   ALT 18 0 - 44 IU/L  Lipid panel   Collection Time: 08/10/24  3:28 PM  Result Value Ref Range   Cholesterol, Total 116 100 - 199 mg/dL   Triglycerides 834 (H) 0 - 149 mg/dL   HDL 30 (L) >60 mg/dL   VLDL Cholesterol Cal 28 5 - 40 mg/dL   LDL Chol Calc (NIH) 58 0 - 99 mg/dL   Chol/HDL Ratio 3.9 0.0 - 5.0 ratio  CBC with Differential/Platelet   Collection Time:  08/10/24  3:28 PM  Result Value Ref Range   WBC 9.4 3.4 - 10.8 x10E3/uL   RBC 4.79 4.14 - 5.80 x10E6/uL   Hemoglobin 15.8 13.0 - 17.7 g/dL   Hematocrit 54.3 62.4 - 51.0 %   MCV 95 79 - 97 fL   MCH 33.0 26.6 - 33.0 pg   MCHC 34.6 31.5 - 35.7 g/dL   RDW 87.4 88.3 - 84.5 %   Platelets 321 150 - 450 x10E3/uL   Neutrophils 54 Not Estab. %   Lymphs 32 Not Estab. %   Monocytes 10 Not Estab. %   Eos 3 Not Estab. %   Basos 1 Not Estab. %   Neutrophils Absolute 5.1 1.4 - 7.0 x10E3/uL   Lymphocytes Absolute 3.0 0.7 - 3.1 x10E3/uL   Monocytes Absolute 0.9 0.1 - 0.9 x10E3/uL   EOS (ABSOLUTE) 0.3 0.0 - 0.4 x10E3/uL   Basophils Absolute 0.1 0.0 - 0.2 x10E3/uL   Immature Granulocytes 0 Not Estab. %   Immature Grans (Abs) 0.0 0.0 - 0.1 x10E3/uL  Thyroid  Panel With TSH   Collection Time: 08/10/24  3:28 PM  Result Value Ref Range   TSH 1.270 0.450 - 4.500 uIU/mL   T4, Total 8.0 4.5 - 12.0 ug/dL   T3 Uptake Ratio 29 24 - 39 %   Free Thyroxine Index 2.3 1.2 - 4.9       Pertinent labs & imaging results that were available during my care of the patient were reviewed by me and considered in my medical decision making.  Assessment & Plan:  Porfirio Bollier was seen today for anxiety.  Diagnoses and all orders for this visit:  Depression, major, single episode, moderate (HCC) -     hydrOXYzine  (VISTARIL ) 25 MG capsule; Take 1 capsule (25 mg total) by mouth every 8 (eight) hours as needed for anxiety (insomnia). -     citalopram   (CELEXA ) 20 MG tablet; Take 1 tablet (20 mg total) by mouth daily.  Generalized anxiety disorder -     hydrOXYzine  (VISTARIL ) 25 MG capsule; Take 1 capsule (25 mg total) by mouth every 8 (eight) hours as needed for anxiety (insomnia). -     citalopram  (CELEXA ) 20 MG tablet; Take 1 tablet (20 mg total) by mouth daily.  Sleep disorder -     hydrOXYzine  (VISTARIL ) 25 MG capsule; Take 1 capsule (25 mg total) by mouth every 8 (eight) hours as needed for anxiety (insomnia).  Epidermal cyst -     I & D  Type 2 diabetes mellitus with other specified complication, without long-term current use of insulin  (HCC) Well controlled      Epidermoid cyst of right upper back Epidermoid cyst located on the right shoulder blade, identified by his partner. The cyst is bothersome but not causing significant pain. Previous cyst excision was performed by another provider. The current cyst is likely an epidermoid cyst, not an abscess, and is due to sebaceous tissue buildup. Complete excision is necessary to prevent recurrence. - Excised the epidermoid cyst from the right upper back - Sent excised tissue to pathology for confirmation if necessary  Type 2 diabetes mellitus Managed with Ozempic . Blood glucose levels are generally well-controlled with occasional high readings. He uses [place] orange juice to manage low blood glucose levels. Dietary management is being supported by his partner. - Continue current diabetes management with Ozempic  - Monitor blood glucose levels and manage hypoglycemia with [place] orange juice as needed  Anxiety disorder Managed with Celexa  and hydroxyzine . Celexa  has  been effective in improving mood and reducing anxiety symptoms. Hydroxyzine  is used as needed, primarily on weekends, for sleep. Anxiety persists due to recent family bereavement and ongoing concerns about his stepfather's health. Current Celexa  dose is 10 mg, with plans to increase to 20 mg for better control. -  Increased Celexa  to 20 mg daily after current supply is exhausted - Continue hydroxyzine  as needed for sleep  Insomnia Persists, particularly during the workweek, despite the use of hydroxyzine  on weekends. Anxiety related to family issues may contribute to sleep disturbances. - Continue current management with hydroxyzine  as needed for sleep          Continue all other maintenance medications.  Follow up plan: Return if symptoms worsen or fail to improve.   Continue healthy lifestyle choices, including diet (rich in fruits, vegetables, and lean proteins, and low in salt and simple carbohydrates) and exercise (at least 30 minutes of moderate physical activity daily).  Educational handout given for GAD  The above assessment and management plan was discussed with the patient. The patient verbalized understanding of and has agreed to the management plan. Patient is aware to call the clinic if they develop any new symptoms or if symptoms persist or worsen. Patient is aware when to return to the clinic for a follow-up visit. Patient educated on when it is appropriate to go to the emergency department.   Rosaline Bruns, FNP-C [facility] [phone]

## 2024-10-04 ENCOUNTER — Encounter: Payer: Self-pay | Admitting: Radiology

## 2024-10-05 ENCOUNTER — Encounter: Payer: Self-pay | Admitting: Family Medicine

## 2024-10-06 ENCOUNTER — Encounter: Payer: Self-pay | Admitting: Family Medicine

## 2024-10-06 NOTE — Telephone Encounter (Signed)
 Letter sent for dismissal through MyChart

## 2024-10-13 ENCOUNTER — Other Ambulatory Visit (HOSPITAL_COMMUNITY): Payer: Self-pay

## 2024-10-13 NOTE — Progress Notes (Unsigned)
   10/13/2024 Name: Zachary Estrada MRN: [id number] DOB: 18-May-1961  No chief complaint on file.   Zachary Estrada is a 63 y.o. year old male who presented for a telephone visit.   They were referred to the pharmacist by their PCP for assistance in managing diabetes.    Subjective:  Care Team: Primary Care Provider: Severa Rock HERO, FNP ; Next Scheduled Visit: not scheduled {careteamprovider:27366}  Medication Access/Adherence  Current Pharmacy:  [facility] [phone] - [place], [place] - [address] [place] [address] Phone: [phone] Fax: [phone]   Patient reports affordability concerns with their medications: {YES/NO:21197} Patient reports access/transportation concerns to their pharmacy: {YES/NO:21197} Patient reports adherence concerns with their medications:  {YES/NO:21197} ***   Diabetes:  Current medications:  - Glimepiride  4 mg twice daily - Metformin  500 mg twice daily - Ozempic  0.5 mg weekly Medications tried in the past:  - Trulicity  0.75 mg weekly 05/2024 - 06/2024  Current glucose readings: A1C 7.4% 08/10/2024 -> down from prior  Date of Download: *** % Time CGM is active: ***% Average Glucose: *** mg/dL Glucose Management Indicator: ***  Glucose Variability: *** (goal <36%) Time in Goal:  - Time in range 70-180: ***% - Time above range: ***% - Time below range: ***% Observed patterns:  Patient {Actions; denies-reports:120008} hypoglycemic s/sx including ***dizziness, shakiness, sweating. Patient {Actions; denies-reports:120008} hyperglycemic symptoms including ***polyuria, polydipsia, polyphagia, nocturia, neuropathy, blurred vision.  Current meal patterns:  - Breakfast: *** - Lunch *** - Supper *** - Snacks *** - Drinks ***  Current physical activity: ***  Current medication access support: ***  Macrovascular and Microvascular Risk Reduction:  Statin? {DM Statin Pharmacy:33491}; ACEi/ARB? {DM ACEi/ARB  pharmacy:33492} Last urinary albumin/creatinine ratio:  Lab Results  Component Value Date   MICRALBCREAT 4 12/13/2022   MICRALBCREAT 5 12/04/2021   Last eye exam:  Lab Results  Component Value Date   HMDIABEYEEXA No Retinopathy 06/22/2024   Last foot exam: 05/05/2024 Tobacco Use:  Tobacco Use: Low Risk  (09/30/2024)   Patient History    Smoking Tobacco Use: Never    Smokeless Tobacco Use: Never    Passive Exposure: Not on file     Objective:  Lab Results  Component Value Date   HGBA1C 7.4 (H) 08/10/2024    Lab Results  Component Value Date   CREATININE 0.74 (L) 08/10/2024   BUN 13 08/10/2024   NA 139 08/10/2024   K 4.0 08/10/2024   CL 102 08/10/2024   CO2 21 08/10/2024    Lab Results  Component Value Date   CHOL 116 08/10/2024   HDL 30 (L) 08/10/2024   LDLCALC 58 08/10/2024   TRIG 165 (H) 08/10/2024   CHOLHDL 3.9 08/10/2024    Medications Reviewed Today   Medications were not reviewed in this encounter       Assessment/Plan:   {Pharmacy A/P Choices:26421}  Follow Up Plan: ***  *** Consider dc glimepiride

## 2024-10-14 ENCOUNTER — Ambulatory Visit: Admitting: Student in an Organized Health Care Education/Training Program

## 2024-10-14 ENCOUNTER — Other Ambulatory Visit: Payer: Self-pay

## 2024-10-14 ENCOUNTER — Encounter: Payer: Self-pay | Admitting: Student in an Organized Health Care Education/Training Program

## 2024-10-14 ENCOUNTER — Encounter: Payer: Self-pay | Admitting: Family Medicine

## 2024-10-14 VITALS — BP 151/98 | HR 67 | Ht 68.0 in | Wt 177.0 lb

## 2024-10-14 DIAGNOSIS — E1159 Type 2 diabetes mellitus with other circulatory complications: Secondary | ICD-10-CM

## 2024-10-14 DIAGNOSIS — J301 Allergic rhinitis due to pollen: Secondary | ICD-10-CM | POA: Diagnosis not present

## 2024-10-14 DIAGNOSIS — E1169 Type 2 diabetes mellitus with other specified complication: Secondary | ICD-10-CM

## 2024-10-14 DIAGNOSIS — I152 Hypertension secondary to endocrine disorders: Secondary | ICD-10-CM

## 2024-10-14 DIAGNOSIS — Z7984 Long term (current) use of oral hypoglycemic drugs: Secondary | ICD-10-CM

## 2024-10-14 DIAGNOSIS — Z7985 Long-term (current) use of injectable non-insulin antidiabetic drugs: Secondary | ICD-10-CM

## 2024-10-14 DIAGNOSIS — F411 Generalized anxiety disorder: Secondary | ICD-10-CM | POA: Diagnosis not present

## 2024-10-14 DIAGNOSIS — Z23 Encounter for immunization: Secondary | ICD-10-CM

## 2024-10-14 DIAGNOSIS — E785 Hyperlipidemia, unspecified: Secondary | ICD-10-CM

## 2024-10-14 MED ORDER — FLUTICASONE PROPIONATE 50 MCG/ACT NA SUSP: 1.0000 | Freq: Every day | NASAL | 10 refills | Status: AC

## 2024-10-14 NOTE — Assessment & Plan Note (Signed)
 Allergic rhinitis is managed with Flonase . Refilled Flonase  prescription.

## 2024-10-14 NOTE — Assessment & Plan Note (Signed)
 Hyperlipidemia is managed with atorvastatin . Continue atorvastatin .  This is primary prevention.

## 2024-10-14 NOTE — Assessment & Plan Note (Signed)
 Depression and anxiety have improved with Celexa  20 mg. Hydroxyzine  is used for sleep as needed. Continue Celexa  20 mg daily. Uses hydroxyzine  as needed for sleep, primarily on weekends, I recommended this as a short-term medication especially given his concern over family history of dementia and his job as a naval architect.
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
## 2024-10-14 NOTE — Patient Instructions (Signed)
  VISIT SUMMARY: During your visit, we discussed your concerns about managing your diabetes, particularly regarding your medication regimen. We also reviewed your hypertension, cholesterol, anxiety, sleep disturbances, allergic rhinitis, tinnitus, and the healing of your recent skin cyst excision.  YOUR PLAN: -TYPE 2 DIABETES MELLITUS: Type 2 diabetes is a condition where your body does not use insulin  properly, leading to high blood sugar levels. Your blood sugar control has improved with Ozempic , but due to insurance issues, you are currently taking metformin  and glimepiride . We will coordinate with the pharmacist to continue Ozempic  if possible. Please continue taking metformin  and glimepiride  as prescribed. We will recheck your hemoglobin A1c in one month.  -ESSENTIAL HYPERTENSION: Hypertension, or high blood pressure, is well-controlled with your current medication, Maxzide . Please continue taking half a tablet daily.  -HYPERLIPIDEMIA: Hyperlipidemia is a condition where you have high levels of fats (lipids) in your blood. This is managed with atorvastatin . Please continue taking atorvastatin  as prescribed.  -DEPRESSION AND ANXIETY: Your depression and anxiety have improved with Celexa , and you use hydroxyzine  for sleep as needed. Please continue taking Celexa  20 mg daily and use hydroxyzine  as needed for sleep, primarily on weekends.  -ALLERGIC RHINITIS: Allergic rhinitis is an allergic reaction that causes sneezing, congestion, and a runny nose. This is managed with Flonase . Your Flonase  prescription has been refilled.  -TINNITUS: Tinnitus is a condition where you hear ringing or other noises in your ears that are not caused by an external sound. It is not associated with hearing loss or dementia in your case. We reassured you that your tinnitus is not linked to dementia.  -STATUS POST EXCISION OF SKIN CYST, RIGHT SHOULDER BLADE: You recently had a skin cyst removed from your right shoulder  blade. The site is healing well with no complications. Please monitor the healing of the excision site.  INSTRUCTIONS: We will recheck your hemoglobin A1c in one month. Please continue your current medications as prescribed and monitor the healing of your excision site.

## 2024-10-14 NOTE — Assessment & Plan Note (Signed)
 Chronic issue.  Blood pressure elevated today in the office.  Has been using Maxide half a tablet for many years.  Will follow-up on this at next visit.  If blood pressure remains elevated, would like to switch Maxide to Hyzaar, I think an ARB component would be helpful given his underlying diabetes.

## 2024-10-14 NOTE — Assessment & Plan Note (Signed)
 Glycemic control has improved with Ozempic , reducing hemoglobin A1c to 7.4% and stabilizing blood glucose levels. Coordinate with the pharmacist to continue Ozempic . Continue metformin  and glimepiride . Recheck hemoglobin A1c in one month.

## 2024-10-20 ENCOUNTER — Encounter: Payer: Self-pay | Admitting: Pharmacist

## 2024-10-20 ENCOUNTER — Other Ambulatory Visit (HOSPITAL_COMMUNITY): Payer: Self-pay

## 2024-10-20 ENCOUNTER — Other Ambulatory Visit: Payer: Self-pay | Admitting: Pharmacist

## 2024-10-20 NOTE — Progress Notes (Signed)
 Patient was previously with [facility]. He was working with their Clinical Pharmacist Practitioner Mliss Griffin on lowering the cost of Ozempic  so patient would continue to take it. His A1c has improved from 9.4% to 7.4% since he started Ozempic  He is currently taking Ozmepic 0.5mg  weekly. His last dose was 10/12/2024.   Coordinate with Mliss regarding plan for Ozempic .  Patient will get Ozempic  2mg  pens - cost will be around $382/pen.  He will use the click method to administer 0.5mg  of Ozempic  weekly. Patient will use 19 clicks weekly  Ozempic  8 mg/3 mL - Gold Color Pen Dose  # Clicks  0.25 mg 10 clicks  0.5 mg 19 clicks  1 mg 37 clicks  1.5 mg 56 clicks  2 mg 74 clicks

## 2024-10-20 NOTE — Telephone Encounter (Signed)
 Patient can get Ozempic  2mg  pen for about $380. He is requesting Rx be sent to [facility] for home delivery.

## 2024-10-21 ENCOUNTER — Other Ambulatory Visit (HOSPITAL_COMMUNITY): Payer: Self-pay

## 2024-10-21 ENCOUNTER — Other Ambulatory Visit: Payer: Self-pay

## 2024-10-21 MED ORDER — SEMAGLUTIDE (2 MG/DOSE) 8 MG/3ML ~~LOC~~ SOPN
2.0000 mg | PEN_INJECTOR | SUBCUTANEOUS | 1 refills | Status: AC
  Filled 2024-10-21: qty 3, 28d supply, fill #0

## 2024-11-04 ENCOUNTER — Other Ambulatory Visit: Payer: Self-pay | Admitting: Pharmacist

## 2024-11-04 NOTE — Progress Notes (Signed)
 11/04/2024 Name: Zachary Estrada MRN: [id number] DOB: 03/01/1961  Chief Complaint  Patient presents with   Diabetes   Medication Management    Zachary Estrada is a 63 y.o. year old male who presented for a telephone visit.   They were referred to the pharmacist by their PCP for assistance in managing diabetes and medication access.    Subjective:  Care Team: Primary Care Provider: Jerrell Cleatus Ned, MD ; Next Scheduled Visit: 11/11/2024  Medication Access/Adherence  Current Pharmacy:  [facility] [phone] - [place] [place] - [address] [place] [address] Phone: [phone] Fax: [phone]  [facility] - [facility] [address] [place] [address] Phone: [phone] Fax: [phone]   Patient reports affordability concerns with their medications: Yes  Patient reports access/transportation concerns to their pharmacy: No  Patient reports adherence concerns with their medications:  No      Diabetes:  Current medications: metformin  XR 500mg  - take 2 tabs twice a day; gimepiride 4mg  - take 2 tabs = 8mg  daily with breakfast; Ozempic  - using the 2mg  pens but injecting 0.5mg  =   Current glucose readings: 112 to 120's per patient     Macrovascular and Microvascular Risk Reduction:  Statin? yes (atorvastatin ); ACEi/ARB? no; no history of therapy but is indicated Last urinary albumin/creatinine ratio:  Lab Results  Component Value Date   MICRALBCREAT 4 12/13/2022   MICRALBCREAT 5 12/04/2021   Last eye exam:  Lab Results  Component Value Date   HMDIABEYEEXA No Retinopathy 06/22/2024   Last foot exam: 05/05/2024 Tobacco Use:  Tobacco Use: Low Risk  (10/14/2024)   Patient History    Smoking Tobacco Use: Never    Smokeless Tobacco Use: Never    Passive Exposure: Not on file   BP Readings from Last 3 Encounters:  10/14/24 (!) 151/98  09/30/24 137/81  08/11/24 128/76     Objective:  Lab  Results  Component Value Date   HGBA1C 7.4 (H) 08/10/2024    Lab Results  Component Value Date   CREATININE 0.74 (L) 08/10/2024   BUN 13 08/10/2024   NA 139 08/10/2024   K 4.0 08/10/2024   CL 102 08/10/2024   CO2 21 08/10/2024    Lab Results  Component Value Date   CHOL 116 08/10/2024   HDL 30 (L) 08/10/2024   LDLCALC 58 08/10/2024   TRIG 165 (H) 08/10/2024   CHOLHDL 3.9 08/10/2024    Medications Reviewed Today     Reviewed by Carla Milling, RPH-CPP (Pharmacist) on 11/04/24 at 1514  Med List Status: <None>   Medication Order Taking? Sig Documenting Provider Last Dose Status Informant  atorvastatin  (LIPITOR) 20 MG tablet [id number] Yes Take 1 tablet (20 mg total) by mouth daily. Severa Rock HERO, FNP  Active   citalopram  (CELEXA ) 20 MG tablet [id number] Yes Take 1 tablet (20 mg total) by mouth daily. Severa Rock HERO, FNP  Active   fluticasone  (FLONASE ) 50 MCG/ACT nasal spray [id number]  Place 1 spray into both nostrils daily. Jerrell Cleatus Ned, MD  Active   glimepiride  (AMARYL ) 4 MG tablet [id number] Yes Take 2 tablets (8 mg total) by mouth daily with breakfast. Severa Rock HERO, FNP  Active   levocetirizine (XYZAL ) 5 MG tablet [id number] Yes Take 1 tablet (5 mg total) by mouth every evening. Severa Rock HERO, FNP  Active   metFORMIN  (GLUCOPHAGE -XR) 500 MG 24 hr tablet [id number] Yes Take 2 tablets (1,000 mg total) by mouth daily  with breakfast. Severa Rock HERO, FNP  Active   Semaglutide , 2 MG/DOSE, 8 MG/3ML SOPN [id number] Yes Inject 2 mg as directed once a week. Jerrell Cleatus Ned, MD  Active   triamterene -hydrochlorothiazide  (MAXZIDE -25) 37.5-25 MG tablet [id number] Yes Take 0.5 tablets by mouth daily. Severa Rock HERO, FNP  Active               Assessment/Plan:   Diabetes: - Currently uncontrolled; goal A1c <7% but improving; Cardiorenal risk reduction is optimized.. Blood pressure is not at goal <130/80. LDL is at goal.  - Reviewed goal A1c, goal fasting, and goal 2 hour  post prandial glucose. Recommended to check glucose daily - Recommend to continue Ozempic , glimepiride  and metformin . - Patient denies personal or family history of multiple endocrine neoplasia type 2, medullary thyroid  cancer; personal history of pancreatitis or gallbladder disease., Discussed side effects of gastrointestinal upset/nausea; eating smaller meals, avoiding high-fat foods, and remaining upright after eating may reduce nausea. Discussed that overeating is a major trigger of nausea with this class of medications, as often times patients will start to feel full sooner and may need to decrease portion sizes from what they were previously accustomed to.  - patient only received #4 pen needles with his Ozempic  - will provide samples for him (will be at [facility] office again 12/9 or 12/11 and will leave them at the front desk)   Follow Up Plan: 2 to 3 months  Madelin Ray, PharmD Clinical Pharmacist [facility]  Population Health [phone]

## 2024-11-11 ENCOUNTER — Ambulatory Visit: Admitting: Student in an Organized Health Care Education/Training Program

## 2024-11-11 ENCOUNTER — Encounter: Payer: Self-pay | Admitting: Student in an Organized Health Care Education/Training Program

## 2024-11-11 VITALS — BP 131/95 | HR 65 | Wt 175.0 lb

## 2024-11-11 DIAGNOSIS — F411 Generalized anxiety disorder: Secondary | ICD-10-CM

## 2024-11-11 DIAGNOSIS — I152 Hypertension secondary to endocrine disorders: Secondary | ICD-10-CM

## 2024-11-11 DIAGNOSIS — E1169 Type 2 diabetes mellitus with other specified complication: Secondary | ICD-10-CM

## 2024-11-11 LAB — POCT GLYCOSYLATED HEMOGLOBIN (HGB A1C): Hemoglobin A1C: 7 % — AB (ref 4.0–5.6)

## 2024-11-11 NOTE — Assessment & Plan Note (Signed)
 Hypertension is well-controlled with a blood pressure of 131/95 mmHg. Continue Triamterene -hydrochlorothiazide .

## 2024-11-11 NOTE — Patient Instructions (Signed)
°  VISIT SUMMARY: Today, we discussed your ongoing management of type 2 diabetes and anxiety. Your A1c has improved to 7.0%, indicating better blood sugar control. We also addressed your anxiety, particularly related to driving in snowy conditions, and noted improvements with your current medication. Additionally, we reviewed your blood pressure and general health maintenance.  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 Your A1c has improved to 7.0%, which is a good sign. Continue taking Metformin , Glimepiride , and Semaglutide  (Ozempic ). We will order an ultrasound to check the blood flow in your feet and a urine test to check for proteinuria, which can be a sign of kidney issues.  -HYPERTENSION ASSOCIATED WITH TYPE 2 DIABETES MELLITUS: Hypertension, or high blood pressure, is often associated with diabetes. Your blood pressure is well-controlled at 131/95 mmHg. Continue taking Triamterene -hydrochlorothiazide  as prescribed.  -GENERALIZED ANXIETY DISORDER: Generalized anxiety disorder is a condition characterized by excessive worry about various aspects of life. Your anxiety, especially related to driving in snowy conditions, has improved with Citalopram  20 mg daily. Continue taking Citalopram  20 mg daily and use Hydroxyzine  as needed for sleep.  -GENERAL HEALTH MAINTENANCE: We discussed routine health maintenance. Continue with your current medications and follow up every three months to monitor your health.  INSTRUCTIONS: Please schedule an ultrasound to assess the blood flow in your feet and provide a urine sample for testing. Continue with your current medications and follow up in three months for ongoing management of your conditions.

## 2024-11-11 NOTE — Progress Notes (Signed)
 Established Patient Office Visit  Patient ID: Zachary Estrada, male    DOB: 21-Feb-1961  Age: 63 y.o. MRN: [id number] PCP: Jerrell Cleatus Ned, MD  Chief Complaint  Patient presents with   Diabetes    4 week recheck      Subjective:     HPI  Discussed the use of AI scribe software for clinical note transcription with the patient, who gave verbal consent to proceed.  History of Present Illness Zachary Estrada is a 63 year old male with type 2 diabetes and anxiety who presents for follow-up of his diabetes management and anxiety symptoms.  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 He is currently taking Celexa , which was increased from 10 mg to 20 mg, and his wife has noticed a significant improvement in his calmness. He also uses hydroxyzine  on weekends to aid sleep, as it provides him with the best rest, though he avoids it on workdays due to its sedative effects.  He has a history of type 2 diabetes, previously with an A1c of 9.4%, which has improved to 7.0% with the use of Ozempic , metformin , and glimepiride . He experienced a three-week interruption in Ozempic  use but is now on a 0.5 mg dose, using a pen that requires 19 clicks to administer the correct dose. His wife assists with dietary management, focusing on healthier options like wheat pasta.  He experiences cold feet, which he attributes to poor circulation, and mentioned that his mother had a similar issue. No numbness or tingling, and previous testing indicated no neuropathy. He does not smoke and has never had a habit of smoking. He is concerned about the potential link between his diabetes and circulation issues.  His sleep pattern is irregular due to his work schedule, often driving  through the night and getting limited rest during the day. 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  He has a history of back issues, including a ruptured L4 and a degenerative L5 disc, which occasionally causes a 'lightning bolt' sensation down his leg.     Objective:     BP (!) 131/95   Pulse 65   Wt 175 lb (79.4 kg)   SpO2 99%   BMI 26.61 kg/m   Physical Exam  Gen: Well-appearing man Neck: Normal thyroid , no nodules, no adenopathy, prominent carotid pulsations at the bifurcation of internal/external carotid arteries Heart: Regular, no murmur Lungs: Unlabored, clear throughout Ext: Bilateral feet are cool to the touch, there is no lower extremity edema, I cannot palpate DP pulses bilaterally but there are palpable PT pulses   Results for orders placed or performed in visit on 11/11/24  POCT glycosylated hemoglobin (Hb A1C)  Result Value Ref Range   Hemoglobin A1C 7.0 (A) 4.0 - 5.6 %   HbA1c POC (<> result, manual entry)     HbA1c, POC (prediabetic range)     HbA1c, POC (controlled diabetic range)        Assessment & Plan:   Problem List Items Addressed This Visit       High   Diabetes mellitus (HCC) - Primary (Chronic)   A1c improved to 7.0%, indicating  good glycemic control. Continue Metformin , Glimepiride , and Semaglutide  (Ozempic ).  Microalbumin today.  He reports cold feet, possibly due to poor circulation, but shows no signs of neuropathy.  He has no symptoms of neuropathy, no skin breakdown on foot exam, but very diminished DP pulses.  I suspect he is developing PAD due to previously uncontrolled diabetes, will order Doppler ultrasound to assess foot blood flow.       Relevant Orders   POCT glycosylated hemoglobin (Hb A1C) (Completed)   VAS US  LOWER EXT ART SEG MULTI (SEGMENTALS & LE RAYNAUDS)   Microalbumin / creatinine urine ratio    Hypertension associated with type 2 diabetes mellitus (HCC) (Chronic)   Hypertension is well-controlled with a blood pressure of 131/95 mmHg. Continue Triamterene -hydrochlorothiazide .      Generalized anxiety disorder (Chronic)   Managed with Citalopram  20 mg daily, showing significant symptom improvement. Hydroxyzine  is effective for sleep. Continue Citalopram  20 mg daily and use Hydroxyzine  as needed for sleep.       Return in about 3 months (around 02/09/2025).    Cleatus Debby Specking, MD [facility] at [facility]

## 2024-11-11 NOTE — Assessment & Plan Note (Signed)
 A1c improved to 7.0%, indicating good glycemic control. Continue Metformin , Glimepiride , and Semaglutide  (Ozempic ).  Microalbumin today.  He reports cold feet, possibly due to poor circulation, but shows no signs of neuropathy.  He has no symptoms of neuropathy, no skin breakdown on foot exam, but very diminished DP pulses.  I suspect he is developing PAD due to previously uncontrolled diabetes, will order Doppler ultrasound to assess foot blood flow.

## 2024-11-11 NOTE — Assessment & Plan Note (Signed)
 Managed with Citalopram  20 mg daily, showing significant symptom improvement. Hydroxyzine  is effective for sleep. Continue Citalopram  20 mg daily and use Hydroxyzine  as needed for sleep.

## 2024-11-17 ENCOUNTER — Other Ambulatory Visit: Payer: Self-pay | Admitting: Student in an Organized Health Care Education/Training Program

## 2024-11-17 ENCOUNTER — Other Ambulatory Visit: Payer: Self-pay | Admitting: Family Medicine

## 2024-11-17 DIAGNOSIS — I152 Hypertension secondary to endocrine disorders: Secondary | ICD-10-CM

## 2024-11-17 NOTE — Telephone Encounter (Signed)
 Okay to refill medication under your name?

## 2024-11-22 ENCOUNTER — Ambulatory Visit: Attending: Student in an Organized Health Care Education/Training Program

## 2024-11-22 DIAGNOSIS — E1169 Type 2 diabetes mellitus with other specified complication: Secondary | ICD-10-CM

## 2024-11-22 LAB — VAS US LOWER EXT ART SEG MULTI (SEGMENTALS & LE RAYNAUDS)
Left ABI: 1.22
Right ABI: 1.23

## 2024-11-23 ENCOUNTER — Ambulatory Visit: Payer: Self-pay | Admitting: Student in an Organized Health Care Education/Training Program

## 2024-11-24 ENCOUNTER — Other Ambulatory Visit (HOSPITAL_COMMUNITY): Payer: Self-pay

## 2025-01-04 ENCOUNTER — Ambulatory Visit: Admitting: Family Medicine

## 2025-01-06 ENCOUNTER — Other Ambulatory Visit (HOSPITAL_COMMUNITY): Payer: Self-pay

## 2025-01-06 ENCOUNTER — Other Ambulatory Visit: Payer: Self-pay | Admitting: Pharmacist

## 2025-01-06 DIAGNOSIS — E119 Type 2 diabetes mellitus without complications: Secondary | ICD-10-CM

## 2025-01-06 NOTE — Progress Notes (Signed)
 "  01/06/2025 Name: Zachary Estrada MRN: [id number] DOB: 07-31-1961  Chief Complaint  Patient presents with   Medication Management   Diabetes    Zachary Estrada is a 64 y.o. year old male who presented for a telephone visit.    They were referred to the pharmacist by their PCP for assistance in managing diabetes and medication access.    Subjective:  Care Team: Primary Care Provider: Jerrell Cleatus Ned, MD ; Next Scheduled Visit: 02/10/2025  Medication Access/Adherence  Current Pharmacy:  [id number] - [place], [place] - [address] [place] [address] Phone: [phone] Fax: [phone]  [place] [facility] [address] [place] [address] Phone: [phone] Fax: [phone]   Patient reports affordability concerns with their medications: Yes  - has $3000 deductible to meet at first of the year.  Patient reports access/transportation concerns to their pharmacy: No  Patient reports adherence concerns with their medications:  No      Diabetes:  Current medications: metformin  XR 500mg  - take 2 tabs twice a day; gimepiride 4mg  - take 2 tabs = 8mg  daily with breakfast; Ozempic  - using the 2mg  pens but injecting 0.5mg  = 19 clicks weekly  Current glucose readings: 120 to 160   Wt Readings from Last 3 Encounters:  11/11/24 175 lb (79.4 kg)  10/14/24 177 lb (80.3 kg)  09/30/24 176 lb 6.4 oz (80 kg)   Diet: Wife cooks most meals.  Uses whole grain / whole wheat.  Avoids sugar and sugar containing beverages.  Uses zero sugar creamer and yogurt.  Eats lots of vegetables.  Macrovascular and Microvascular Risk Reduction:  Statin? yes (atorvastatin  - taking 10mg  or 0.5 tablet daily ); ACEi/ARB? no; no history of therapy but is indicated. Patient feels that his blood pressure has been well controlled for many years on triamterene  / HCT and does not want to change to add medication for blood pressure / renal protection at  this time.  Last urinary albumin/creatinine ratio:  Lab Results  Component Value Date   MICRALBCREAT 4 12/13/2022   MICRALBCREAT 5 12/04/2021   Last eye exam:  Lab Results  Component Value Date   HMDIABEYEEXA No Retinopathy 06/22/2024   Last foot exam: 05/05/2024 Tobacco Use:  Tobacco Use: Low Risk (11/11/2024)   Patient History    Smoking Tobacco Use: Never    Smokeless Tobacco Use: Never    Passive Exposure: Not on file   BP Readings from Last 3 Encounters:  11/11/24 (!) 131/95  10/14/24 (!) 151/98  09/30/24 137/81     Objective:  Lab Results  Component Value Date   HGBA1C 7.0 (A) 11/11/2024    Lab Results  Component Value Date   CREATININE 0.74 (L) 08/10/2024   BUN 13 08/10/2024   NA 139 08/10/2024   K 4.0 08/10/2024   CL 102 08/10/2024   CO2 21 08/10/2024    Lab Results  Component Value Date   CHOL 116 08/10/2024   HDL 30 (L) 08/10/2024   LDLCALC 58 08/10/2024   TRIG 165 (H) 08/10/2024   CHOLHDL 3.9 08/10/2024    Medications Reviewed Today     Reviewed by Zachary Estrada, RPH-CPP (Pharmacist) on 01/06/25 at 1441  Med List Status: <None>   Medication Order Taking? Sig Documenting Provider Last Dose Status Informant  atorvastatin  (LIPITOR) 20 MG tablet [id number] Yes Take 1 tablet (20 mg total) by mouth daily.  Patient taking differently: Take 10 mg by mouth daily.  Severa Rock HERO, FNP  Active   citalopram  (CELEXA ) 20 MG tablet [id number] Yes Take 1 tablet (20 mg total) by mouth daily. Severa Rock HERO, FNP  Active   fluticasone  (FLONASE ) 50 MCG/ACT nasal spray [id number] Yes Place 1 spray into both nostrils daily. Zachary Cleatus Ned, MD  Active   glimepiride  (AMARYL ) 4 MG tablet [id number] Yes Take 2 tablets (8 mg total) by mouth daily with breakfast. Severa Rock HERO, FNP  Active   levocetirizine (XYZAL ) 5 MG tablet [id number] Yes Take 1 tablet (5 mg total) by mouth every evening. Severa Rock HERO, FNP  Active   metFORMIN  (GLUCOPHAGE -XR) 500 MG 24 hr  tablet [id number] Yes Take 2 tablets (1,000 mg total) by mouth daily with breakfast. Severa Rock HERO, FNP  Active   Semaglutide , 2 MG/DOSE, 8 MG/3ML SOPN [id number] Yes Inject 2 mg as directed once a week.  Patient taking differently: Inject 2 mg as directed once a week. 0.5mg  = 19 clicks   Zachary Cleatus Ned, MD  Active   triamterene -hydrochlorothiazide  (MAXZIDE -25) 37.5-25 MG tablet [id number] Yes TAKE 1/2 TABLET BY MOUTH DAILY Zachary Cleatus Ned, MD  Active               Assessment/Plan:   Diabetes: - Currently controlled; goal A1c <7%; Cardiorenal risk reduction is has opportunities for improvement.. Blood pressure is not at goal <130/80. LDL is at goal.  - Reviewed goal A1c, goal fasting, and goal 2 hour post prandial glucose. Recommended to check glucose daily - Recommend to continue Ozempic , glimepiride  and metformin . - Patient denies personal or family history of multiple endocrine neoplasia type 2, medullary thyroid  cancer; personal history of pancreatitis or gallbladder disease., Discussed side effects of gastrointestinal upset/nausea; eating smaller meals, avoiding high-fat foods, and remaining upright after eating may reduce nausea. Discussed that overeating is a major trigger of nausea with this class of medications, as often times patients will start to feel full sooner and may need to decrease portion sizes from what they were previously accustomed to.  - Requested pharmacy team do a test claim to check his cost of Ozempic  for 2026. Test claim shows patient estimated cost of $261.02. (he injects 0.5mg  weekly to the 2mg  dose pens lasts for 3 months). Patient was hoping cost would be lower but can afford to continue Ozempic .   Zachary Estrada, PharmD Clinical Pharmacist [facility]  Population Health [phone]    "

## 2025-02-10 ENCOUNTER — Ambulatory Visit: Admitting: Student in an Organized Health Care Education/Training Program
